# Patient Record
Sex: Female | Born: 1935 | Race: White | Hispanic: No | Smoking: Never smoker
Health system: Southern US, Community
[De-identification: ages and names within clinical notes are randomized; demographics above are authoritative.]

## PROBLEM LIST (undated history)

## (undated) DIAGNOSIS — S4291XA Fracture of right shoulder girdle, part unspecified, initial encounter for closed fracture: Secondary | ICD-10-CM

## (undated) DIAGNOSIS — I10 Essential (primary) hypertension: Secondary | ICD-10-CM

## (undated) DIAGNOSIS — I517 Cardiomegaly: Secondary | ICD-10-CM

## (undated) DIAGNOSIS — M1990 Unspecified osteoarthritis, unspecified site: Secondary | ICD-10-CM

## (undated) DIAGNOSIS — J449 Chronic obstructive pulmonary disease, unspecified: Secondary | ICD-10-CM

## (undated) DIAGNOSIS — S22000A Wedge compression fracture of unspecified thoracic vertebra, initial encounter for closed fracture: Secondary | ICD-10-CM

## (undated) DIAGNOSIS — J189 Pneumonia, unspecified organism: Secondary | ICD-10-CM

## (undated) DIAGNOSIS — K254 Chronic or unspecified gastric ulcer with hemorrhage: Secondary | ICD-10-CM

## (undated) DIAGNOSIS — M8580 Other specified disorders of bone density and structure, unspecified site: Secondary | ICD-10-CM

## (undated) DIAGNOSIS — F0390 Unspecified dementia without behavioral disturbance: Secondary | ICD-10-CM

## (undated) DIAGNOSIS — R413 Other amnesia: Secondary | ICD-10-CM

## (undated) DIAGNOSIS — E785 Hyperlipidemia, unspecified: Secondary | ICD-10-CM

## (undated) DIAGNOSIS — Z87440 Personal history of urinary (tract) infections: Secondary | ICD-10-CM

## (undated) HISTORY — DX: Chronic obstructive pulmonary disease, unspecified: J44.9

## (undated) HISTORY — DX: Other specified disorders of bone density and structure, unspecified site: M85.80

## (undated) HISTORY — PX: FRACTURE SURGERY: SHX138

## (undated) HISTORY — DX: Hyperlipidemia, unspecified: E78.5

## (undated) HISTORY — DX: Wedge compression fracture of unspecified thoracic vertebra, initial encounter for closed fracture: S22.000A

## (undated) HISTORY — PX: CATARACT EXTRACTION W/ INTRAOCULAR LENS  IMPLANT, BILATERAL: SHX1307

## (undated) HISTORY — DX: Essential (primary) hypertension: I10

## (undated) HISTORY — DX: Cardiomegaly: I51.7

## (undated) HISTORY — DX: Unspecified dementia without behavioral disturbance: F03.90

## (undated) HISTORY — DX: Personal history of urinary (tract) infections: Z87.440

---

## 1989-03-01 DIAGNOSIS — E785 Hyperlipidemia, unspecified: Secondary | ICD-10-CM

## 1989-03-01 HISTORY — DX: Hyperlipidemia, unspecified: E78.5

## 1991-03-02 HISTORY — PX: ANKLE FRACTURE SURGERY: SHX122

## 1995-03-02 DIAGNOSIS — I10 Essential (primary) hypertension: Secondary | ICD-10-CM

## 1995-03-02 HISTORY — DX: Essential (primary) hypertension: I10

## 1996-06-21 HISTORY — PX: SIGMOIDOSCOPY: SUR1295

## 1998-01-30 ENCOUNTER — Inpatient Hospital Stay (HOSPITAL_COMMUNITY): Admission: AD | Admit: 1998-01-30 | Discharge: 1998-02-01 | Payer: Self-pay | Admitting: Internal Medicine

## 1998-08-21 ENCOUNTER — Encounter: Payer: Self-pay | Admitting: Family Medicine

## 1998-08-21 ENCOUNTER — Other Ambulatory Visit: Admission: RE | Admit: 1998-08-21 | Discharge: 1998-08-21 | Payer: Self-pay | Admitting: Family Medicine

## 1999-07-24 ENCOUNTER — Encounter: Payer: Self-pay | Admitting: Family Medicine

## 1999-07-24 LAB — CONVERTED CEMR LAB: Blood Glucose, Fasting: 102 mg/dL

## 2001-05-24 ENCOUNTER — Encounter: Payer: Self-pay | Admitting: Family Medicine

## 2001-05-24 LAB — CONVERTED CEMR LAB: TSH: 3.58 microintl units/mL

## 2002-09-18 ENCOUNTER — Encounter: Payer: Self-pay | Admitting: Family Medicine

## 2003-12-17 ENCOUNTER — Ambulatory Visit: Payer: Self-pay | Admitting: Family Medicine

## 2004-02-04 ENCOUNTER — Ambulatory Visit: Payer: Self-pay | Admitting: Family Medicine

## 2004-02-04 LAB — CONVERTED CEMR LAB: TSH: 3.4 microintl units/mL

## 2004-11-25 ENCOUNTER — Ambulatory Visit: Payer: Self-pay | Admitting: Family Medicine

## 2004-11-27 ENCOUNTER — Ambulatory Visit: Payer: Self-pay | Admitting: Family Medicine

## 2004-12-14 ENCOUNTER — Ambulatory Visit: Payer: Self-pay | Admitting: Family Medicine

## 2004-12-29 ENCOUNTER — Ambulatory Visit: Payer: Self-pay | Admitting: Family Medicine

## 2004-12-29 LAB — HM MAMMOGRAPHY: HM Mammogram: NORMAL

## 2005-11-11 ENCOUNTER — Ambulatory Visit: Payer: Self-pay | Admitting: Family Medicine

## 2005-11-16 ENCOUNTER — Encounter: Payer: Self-pay | Admitting: Family Medicine

## 2005-11-16 LAB — CONVERTED CEMR LAB
Blood Glucose, Fasting: 105 mg/dL
TSH: 2.86 microintl units/mL

## 2006-03-21 ENCOUNTER — Ambulatory Visit: Payer: Self-pay | Admitting: Family Medicine

## 2006-10-19 ENCOUNTER — Encounter: Payer: Self-pay | Admitting: Family Medicine

## 2006-10-19 DIAGNOSIS — R7309 Other abnormal glucose: Secondary | ICD-10-CM | POA: Insufficient documentation

## 2006-10-19 DIAGNOSIS — E785 Hyperlipidemia, unspecified: Secondary | ICD-10-CM

## 2006-10-19 DIAGNOSIS — Z8719 Personal history of other diseases of the digestive system: Secondary | ICD-10-CM

## 2006-10-19 DIAGNOSIS — I10 Essential (primary) hypertension: Secondary | ICD-10-CM | POA: Insufficient documentation

## 2007-05-30 ENCOUNTER — Ambulatory Visit: Payer: Self-pay | Admitting: Family Medicine

## 2007-05-31 LAB — CONVERTED CEMR LAB
AST: 20 units/L (ref 0–37)
Alkaline Phosphatase: 60 units/L (ref 39–117)
Basophils Absolute: 0.1 10*3/uL (ref 0.0–0.1)
Chloride: 105 meq/L (ref 96–112)
Direct LDL: 143 mg/dL
Eosinophils Absolute: 0.1 10*3/uL (ref 0.0–0.7)
GFR calc non Af Amer: 100 mL/min
HDL: 88.5 mg/dL (ref >39.0)
MCHC: 32.8 g/dL (ref 30.0–36.0)
MCV: 90.8 fL (ref 78.0–100.0)
Neutrophils Relative %: 62.1 % (ref 43.0–77.0)
Platelets: 322 10*3/uL (ref 150–400)
Potassium: 4.2 meq/L (ref 3.5–5.1)
RDW: 12.4 % (ref 11.5–14.6)
Total Bilirubin: 0.7 mg/dL (ref 0.3–1.2)
Triglycerides: 73 mg/dL (ref 0–149)

## 2007-06-28 ENCOUNTER — Ambulatory Visit: Payer: Self-pay | Admitting: Family Medicine

## 2007-06-28 LAB — CONVERTED CEMR LAB
OCCULT 1: NEGATIVE
OCCULT 2: NEGATIVE
OCCULT 3: NEGATIVE

## 2007-06-29 ENCOUNTER — Encounter (INDEPENDENT_AMBULATORY_CARE_PROVIDER_SITE_OTHER): Payer: Self-pay | Admitting: *Deleted

## 2007-08-29 ENCOUNTER — Encounter (INDEPENDENT_AMBULATORY_CARE_PROVIDER_SITE_OTHER): Payer: Self-pay | Admitting: *Deleted

## 2007-12-04 ENCOUNTER — Ambulatory Visit: Payer: Self-pay | Admitting: Family Medicine

## 2007-12-04 ENCOUNTER — Encounter: Payer: Self-pay | Admitting: Family Medicine

## 2008-01-30 DIAGNOSIS — S4291XA Fracture of right shoulder girdle, part unspecified, initial encounter for closed fracture: Secondary | ICD-10-CM

## 2008-01-30 HISTORY — DX: Fracture of right shoulder girdle, part unspecified, initial encounter for closed fracture: S42.91XA

## 2008-02-10 ENCOUNTER — Emergency Department: Payer: Self-pay | Admitting: Internal Medicine

## 2008-02-17 ENCOUNTER — Emergency Department: Payer: Self-pay | Admitting: Internal Medicine

## 2008-05-30 ENCOUNTER — Ambulatory Visit: Payer: Self-pay | Admitting: Family Medicine

## 2008-05-30 LAB — CONVERTED CEMR LAB
ALT: 14 units/L (ref 0–35)
AST: 21 units/L (ref 0–37)
Albumin: 3.7 g/dL (ref 3.5–5.2)
Basophils Absolute: 0 10*3/uL (ref 0.0–0.1)
Chloride: 105 meq/L (ref 96–112)
Cholesterol: 225 mg/dL — ABNORMAL HIGH (ref 0–200)
Creatinine, Ser: 0.5 mg/dL (ref 0.4–1.2)
Creatinine,U: 84.5 mg/dL
Direct LDL: 129 mg/dL
Eosinophils Relative: 2.1 % (ref 0.0–5.0)
GFR calc non Af Amer: 123.23 mL/min (ref >60)
HCT: 39.2 % (ref 36.0–46.0)
Lymphocytes Relative: 26 % (ref 12.0–46.0)
Microalb Creat Ratio: 10.7 mg/g (ref 0.0–30.0)
Microalb, Ur: 0.9 mg/dL (ref 0.0–1.9)
Monocytes Relative: 11.8 % (ref 3.0–12.0)
Neutrophils Relative %: 60 % (ref 43.0–77.0)
Platelets: 281 10*3/uL (ref 150.0–400.0)
Potassium: 4.3 meq/L (ref 3.5–5.1)
RDW: 12.9 % (ref 11.5–14.6)
TSH: 4.06 microintl units/mL (ref 0.35–5.50)
Total CHOL/HDL Ratio: 3
Total Protein: 6.5 g/dL (ref 6.0–8.3)
Triglycerides: 100 mg/dL (ref 0.0–149.0)
WBC: 5.9 10*3/uL (ref 4.5–10.5)

## 2008-06-11 ENCOUNTER — Ambulatory Visit: Payer: Self-pay | Admitting: Family Medicine

## 2008-06-20 ENCOUNTER — Ambulatory Visit: Payer: Self-pay | Admitting: Family Medicine

## 2008-06-21 ENCOUNTER — Encounter (INDEPENDENT_AMBULATORY_CARE_PROVIDER_SITE_OTHER): Payer: Self-pay | Admitting: *Deleted

## 2009-04-08 ENCOUNTER — Ambulatory Visit: Payer: Self-pay | Admitting: Family Medicine

## 2009-06-05 ENCOUNTER — Ambulatory Visit: Payer: Self-pay | Admitting: Family Medicine

## 2009-06-05 LAB — CONVERTED CEMR LAB
ALT: 14 units/L (ref 0–35)
AST: 21 units/L (ref 0–37)
Albumin: 3.7 g/dL (ref 3.5–5.2)
Alkaline Phosphatase: 61 units/L (ref 39–117)
BUN: 13 mg/dL (ref 6–23)
Basophils Relative: 0.8 % (ref 0.0–3.0)
CO2: 30 meq/L (ref 19–32)
Chloride: 108 meq/L (ref 96–112)
Cholesterol: 207 mg/dL — ABNORMAL HIGH (ref 0–200)
Creatinine, Ser: 0.6 mg/dL (ref 0.4–1.2)
Direct LDL: 106.4 mg/dL
Eosinophils Absolute: 0.1 10*3/uL (ref 0.0–0.7)
Eosinophils Relative: 1.8 % (ref 0.0–5.0)
HCT: 38.7 % (ref 36.0–46.0)
Lymphs Abs: 1.5 10*3/uL (ref 0.7–4.0)
MCHC: 34 g/dL (ref 30.0–36.0)
MCV: 89.2 fL (ref 78.0–100.0)
Monocytes Absolute: 0.7 10*3/uL (ref 0.1–1.0)
Neutrophils Relative %: 65.9 % (ref 43.0–77.0)
Platelets: 311 10*3/uL (ref 150.0–400.0)
Potassium: 4.9 meq/L (ref 3.5–5.1)
RBC: 4.33 M/uL (ref 3.87–5.11)
VLDL: 20 mg/dL (ref 0.0–40.0)
WBC: 7.2 10*3/uL (ref 4.5–10.5)

## 2009-06-10 ENCOUNTER — Ambulatory Visit: Payer: Self-pay | Admitting: Family Medicine

## 2009-06-23 ENCOUNTER — Ambulatory Visit: Payer: Self-pay | Admitting: Family Medicine

## 2009-06-23 ENCOUNTER — Encounter (INDEPENDENT_AMBULATORY_CARE_PROVIDER_SITE_OTHER): Payer: Self-pay | Admitting: *Deleted

## 2009-06-23 LAB — CONVERTED CEMR LAB
OCCULT 1: NEGATIVE
OCCULT 3: NEGATIVE

## 2009-06-23 LAB — FECAL OCCULT BLOOD, GUAIAC: Fecal Occult Blood: NEGATIVE

## 2009-10-02 ENCOUNTER — Encounter (INDEPENDENT_AMBULATORY_CARE_PROVIDER_SITE_OTHER): Payer: Self-pay | Admitting: *Deleted

## 2010-04-02 NOTE — Letter (Signed)
Summary: Nadara Eaton letter  [place] at [facility]  [address] [place], [place] [address]   Phone: [phone]  Fax: [phone]       10/02/2009 MRN: [id number]  TAYLORANN TKACH [address] [place], [place]  [address]  Dear Ms. [facility] [place], and [place] announce the retirement of Arta Silence, M.D., from full-time practice at the [facility] office effective August 28, 2009 and his plans of returning part-time.  It is important to Dr. Hetty Ely and to our practice that you understand that [facility] has seven physicians in our office for your health care needs.  We will continue to offer the same exceptional care that you have today.    Dr. Hetty Ely has spoken to many of you about his plans for retirement and returning part-time in the fall.   We will continue to work with you through the transition to schedule appointments for you in the office and meet the high standards that [place] is committed to.   Again, it is with great pleasure that we share the news that Dr. Hetty Ely will return to [facility] at [facility] in October of 2011 with a reduced schedule.    If you have any questions, or would like to request an appointment with one of our physicians, please call us at [phone] and press the option for Scheduling an appointment.  We take pleasure in providing you with excellent patient care and look forward to seeing you at your next office visit.  Our [facility] Physicians are:  Tillman Abide, M.D. Laurita Quint, M.D. Roxy Manns, M.D. Kerby Nora, M.D. Hannah Beat, M.D. Ruthe Mannan, M.D. We proudly welcomed Raechel Ache, M.D. and Eustaquio Boyden, M.D. to the practice in July/August 2011.  Sincerely,  [facility]

## 2010-04-02 NOTE — Assessment & Plan Note (Signed)
Summary: FLU SHOT,PNEUMONIA SHOT/CLE  Nurse Visit   Allergies: No Known Drug Allergies  Orders Added: 1)  Flu Vaccine 52yrs + [90658] 2)  Administration Flu vaccine - MCR [G0008]  Flu Vaccine Consent Questions     Do you have a history of severe allergic reactions to this vaccine? no    Any prior history of allergic reactions to egg and/or gelatin? no    Do you have a sensitivity to the preservative Thimersol? no    Do you have a past history of Guillan-Barre Syndrome? no    Do you currently have an acute febrile illness? no    Have you ever had a severe reaction to latex? no    Vaccine information given and explained to patient? yes    Are you currently pregnant? no    Lot Number:[id number]   Exp Date:08/28/2009   Site Given  Right  Deltoid IM   Patient did not get a pneumonia vaccine because she has already had one since she has turned 77 years old per patient's daughter-in-law.

## 2010-04-02 NOTE — Assessment & Plan Note (Signed)
Summary: MEDICARE CPX  CYD   Vital Signs:  Patient profile:   77 year old female Weight:      125 pounds Temp:     98.1 degrees F oral Pulse rate:   64 / minute Pulse rhythm:   regular BP sitting:   130 / 78  (left arm) Cuff size:   regular  Vitals Entered By: Sydell Axon LPN (06/13/2009 10:49 AM) CC: 30 Minute checkup, hemoccult cards given to patient   History of Present Illness: Pt here with daughter- in-law. She is getting more forgetful. She makes many notes to herself and is doing ok. There seems to be no safety issues as she does lots of cooking and has all of her life. As a result she  therefore is used to checking her stove regularly. She is getting more broad-based in gait but really has very few aches and pains. She feels well and gets along without difficulty, has no pain and continues to be independent.  Preventive Screening-Counseling & Management  Alcohol-Tobacco     Alcohol drinks/day: 0     Smoking Status: never  Caffeine-Diet-Exercise     Caffeine use/day: 2     Does Patient Exercise: yes     Type of exercise: works around the house and yard.  Problems Prior to Update: 1)  Special Screening Malig Neoplasms Other Sites  (ICD-V76.49) 2)  Hyperglycemia  (ICD-790.29) 3)  Menopausal Syndrome  (ICD-627.2) 4)  Hypertension  (ICD-401.9) 5)  Hyperlipidemia  (ICD-272.4) 6)  Gastrointestinal Hemorrhage, Hx of  (ICD-V12.79)  Medications Prior to Update: 1)  Multivitamins   Tabs (Multiple Vitamin) .Marland Kitchen.. 1 Daily By Mouth  Allergies: No Known Drug Allergies  Past History:  Past Medical History: Last updated: 10/19/2006 Gastrointestinal hemorrhage, hx of:(1993) AGAIN 01/1998) Hyperlipidemia:(1991) Hypertension:(1997)  Family History: Last updated: 06-13-2009 Father:? DECEASED 53 YOA STOMACH PROBLEM; TERMINAL PAIN IN STOMACH LOST MIND  Mother: DECEASED 78 YOA ALZHEIMERS  BROTHER A Junious Dresser)  1  SISTER AND 1 BROTHER DIED OF CANCER 1 BROTHER SUICIDE 1  SISTER dec Dementia and senility, natural causes. 2  Sisters Alive Margaret and Estil Daft) HBP: + SISTER, + BROTHER DM: NEGATIVE GOUT/ARTHRITIS:  PROSTATE CANCER: NEGATIVE BREAST/OVARIAN/UTERINE CANCER: NEGATIVE COLON CANCER: STOMACH SISTER// ? 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 DECEASED 53 YOA STOMACH PROBLEM; TERMINAL PAIN IN STOMACH LOST MIND  Mother: DECEASED 55 YOA ALZHEIMERS  BROTHER A Junious Dresser)  1  SISTER AND 1 BROTHER DIED OF CANCER 1 BROTHER SUICIDE 1 SISTER dec Dementia and senility, natural causes. 2  Sisters Alive Margaret and Estil Daft) HBP: + SISTER, + BROTHER DM: NEGATIVE GOUT/ARTHRITIS:  PROSTATE CANCER: NEGATIVE BREAST/OVARIAN/UTERINE CANCER: NEGATIVE COLON CANCER: STOMACH SISTER// ? BROTHER UNKNOWN ETOH/DRUG ABUSE: NEGATIVE DEPRESSION: OTHER: NEGATIVE STROKE  Review of Systems General:  Denies chills, fatigue, fever, sweats, weakness, and weight loss. Eyes:  Complains of blurring; denies eye irritation and eye pain; needs new glasses. ENT:  Complains of decreased hearing; denies ear discharge, earache, and ringing in ears; longstanding hearing loss with bilat hearing aids.. CV:  Denies chest pain or discomfort, fainting, fatigue,  palpitations, shortness of breath with exertion, and weight gain. Resp:  Denies cough, shortness of breath, and wheezing. GI:  Denies abdominal pain, bloody stools, change in bowel habits, constipation, dark tarry stools,  diarrhea, indigestion, loss of appetite, nausea, vomiting, vomiting blood, and yellowish skin color; mild burping at times. One episode of 24 hr gastro a few weeks back.. GU:  Complains of nocturia and urinary frequency; denies dysuria. MS:  Denies joint pain, low back pain, muscle aches, cramps, muscle weakness, and stiffness. Derm:  Denies dryness, itching, and rash. Neuro:  Complains of memory loss; denies numbness, poor balance, tingling, and tremors; mild and functional.  Physical Exam  General:  Well-developed,well-nourished,in no acute distress; alert,appropriate and cooperative throughout examination Head:  Normocephalic and atraumatic without obvious abnormalities. No apparent alopecia or balding. Sinuses NT. Eyes:  Conjunctiva clear bilaterally.  Ears:  External ear exam shows no significant lesions or deformities.  Otoscopic examination reveals clear canals, tympanic membranes are intact bilaterally without bulging, retraction, inflammation or discharge. Hearing is significantlydecreased  with hearing aids in place bilaterally. Nose:  External nasal examination shows no deformity or inflammation. Nasal mucosa are pink and moist without lesions or exudates. Mouth:  Oral mucosa and oropharynx without lesions or exudates.  Teeth in good repair. Neck:  No deformities, masses, or tenderness noted. Chest Wall:  No deformities, masses, or tenderness noted. Breasts:  Not done due to age. Lungs:  Normal respiratory effort, chest expands symmetrically. Lungs are clear to auscultation, no crackles or wheezes. Heart:  Normal rate and regular rhythm. S1 and S2 normal without gallop, murmur, click, rub or other extra sounds. Abdomen:  Bowel sounds positive,abdomen soft and non-tender without masses, organomegaly or hernias noted. Rectal:  Not done due to age. Genitalia:  Not done due to age. Msk:  No deformity or scoliosis noted of thoracic or lumbar spine.   Pulses:  R and L  carotid,radial,femoral,dorsalis pedis and posterior tibial pulses are full and equal bilaterally Extremities:  No clubbing, cyanosis, edema, or deformity noted with normal full range of motion of all joints.   Neurologic:  No cranial nerve deficits noted. Station and gait are normal. Plantar reflexes are down-going bilaterally. DTRs are symmetrical throughout. Sensory, motor and coordinative functions appear intact. Skin:  Intact without suspicious lesions or rashes Cervical Nodes:  No lymphadenopathy noted Inguinal Nodes:  No significant adenopathy Psych:  Cognition and judgment appear intact. Alert and cooperative with normal attention span and concentration. No apparent delusions, illusions, hallucinations   Impression & Recommendations:  Problem # 1:  HYPERGLYCEMIA (ICD-790.29) Assessment Improved Normal today via labs.  Problem # 2:  HYPERTENSION (ICD-401.9) Assessment: Unchanged Good control. On no meds and will try to keep it that way. BP today: 130/78 Prior BP: 100/60 (06/11/2008)  Labs Reviewed: K+: 4.9 (06/05/2009) Creat: : 0.6 (06/05/2009)   Chol: 207 (06/05/2009)   HDL: 85.50 (06/05/2009)   LDL: DEL (05/30/2007)   TG: 100.0 (06/05/2009)  Problem # 3:  HYPERLIPIDEMIA (ICD-272.4) Assessment: Unchanged Adequate nos. Cont watching diet. Labs Reviewed: SGOT: 21 (06/05/2009)   SGPT: 14 (06/05/2009)   HDL:85.50 (06/05/2009), 83.30 (05/30/2008)  LDL:DEL (05/30/2007)  Chol:207 (06/05/2009), 225 (05/30/2008)  Trig:100.0 (06/05/2009), 100.0 (05/30/2008)  Problem # 4:  GASTROINTESTINAL HEMORRHAGE, HX OF (ICD-V12.79) Assessment: Unchanged No evidence of new sxs.  Complete Medication List: 1)  Multivitamins Tabs (Multiple vitamin) .Marland Kitchen.. 1 daily by mouth  Patient Instructions: 1)  RTC as needed.  Current Allergies (reviewed today): No known allergies

## 2010-04-02 NOTE — Letter (Signed)
Summary: Results Follow up Letter  [place] at [facility]  [address] [place], [place] [address]   Phone: [phone]  Fax: [phone]    06/23/2009 MRN: [id number]  JAMYLAH MARINACCIO [address] [place], [place]  [address]  Dear Ms. Leet,  The following are the results of your recent test(s):  Test         Result    Pap Smear:        Normal _____  Not Normal _____ Comments: ______________________________________________________ Cholesterol: LDL(Bad cholesterol):         Your goal is less than:         HDL (Good cholesterol):       Your goal is more than: Comments:  ______________________________________________________ Mammogram:        Normal _____  Not Normal _____ Comments:  ___________________________________________________________________ Hemoccult:        Normal __X___  Not normal _______ Comment: Please repeat in one year.  _____________________________________________________________________ Other Tests:    We routinely do not discuss normal results over the telephone.  If you desire a copy of the results, or you have any questions about this information we can discuss them at your next office visit.   Sincerely,     Laurita Quint, MD

## 2011-02-01 ENCOUNTER — Encounter: Payer: Self-pay | Admitting: Family Medicine

## 2011-02-02 ENCOUNTER — Encounter: Payer: Self-pay | Admitting: Family Medicine

## 2011-02-02 ENCOUNTER — Ambulatory Visit (INDEPENDENT_AMBULATORY_CARE_PROVIDER_SITE_OTHER): Payer: MEDICARE | Admitting: Family Medicine

## 2011-02-02 VITALS — BP 126/80 | HR 98 | Temp 99.0°F | Wt 124.0 lb

## 2011-02-02 DIAGNOSIS — J069 Acute upper respiratory infection, unspecified: Secondary | ICD-10-CM

## 2011-02-02 DIAGNOSIS — J40 Bronchitis, not specified as acute or chronic: Secondary | ICD-10-CM | POA: Insufficient documentation

## 2011-02-02 MED ORDER — AZITHROMYCIN 250 MG PO TABS: ORAL_TABLET | ORAL | Status: AC

## 2011-02-02 NOTE — Patient Instructions (Signed)
Sounds like viral upper respiratory infection, if that is the case should feel better each day. If not improving as expected or fever >101 or worsening cough, fill antibiotics (zpack provided). Get plenty of rest over next few days.  May use simple mucinex to break up cough and robitussin or delsym for cough suppression if needed. Good to see you today, call us with questions or if not improving as expected.

## 2011-02-02 NOTE — Progress Notes (Signed)
  Subjective:    Patient ID: Rhonda Foley, female    DOB: May 15, 1916, 77 y.o.   MRN: [id number]  HPI CC: cough, congestion  Presents with daughter in law.  5d h/o coughing.  Overall dry cough, more hoarse voice.  Decreased appetite.  + head congestion, and RN.  Having coughing fits.  Hasn't tried anything for this.  No congestion in chest.  Denies fevers/chills, sore throat, abd pain, n/v/d, headache, ear pain or tooth pain, CP, SOB.  No sick contacts at home, no smokers at home.  No h/o asthma/COPD.  Flu shot at [facility] this year.  Review of Systems Per HPI    Objective:   Physical Exam  Vitals reviewed. Constitutional: She appears well-developed and well-nourished. No distress.  HENT:  Head: Normocephalic and atraumatic.  Right Ear: External ear normal.  Left Ear: External ear normal.  Nose: No mucosal edema or rhinorrhea. Right sinus exhibits no maxillary sinus tenderness and no frontal sinus tenderness. Left sinus exhibits no maxillary sinus tenderness and no frontal sinus tenderness.  Mouth/Throat: Uvula is midline, oropharynx is clear and moist and mucous membranes are normal. No oropharyngeal exudate.  Eyes: Conjunctivae and EOM are normal. Pupils are equal, round, and reactive to light. No scleral icterus.  Neck: Normal range of motion. Neck supple. No JVD present. No thyromegaly present.  Cardiovascular: Normal rate, regular rhythm, normal heart sounds and intact distal pulses.   No murmur heard. Pulmonary/Chest: Effort normal and breath sounds normal. No respiratory distress. She has no wheezes. She has no rales.       Crackles bibasilarly  Lymphadenopathy:    She has no cervical adenopathy.  Skin: Skin is warm and dry. No rash noted.       Assessment & Plan:

## 2011-02-02 NOTE — Assessment & Plan Note (Signed)
Anticipate viral given short duration. Discussed anticipated course of illness. zpack to hold on to in case not improving as expected. RTC if worsening or concerns. Crackles heard - anticipate from atx as some clearing with deep cough. Good O2 sat, stable vital signs.

## 2011-02-05 ENCOUNTER — Telehealth: Payer: Self-pay | Admitting: Internal Medicine

## 2011-02-05 NOTE — Telephone Encounter (Signed)
Would give abx more time. Could use robitussin or delsym for cough (without decongestant). Any fevers or worsening shortness of breath?  Any burning with voiding? If worsening, may go to [facility] over weekend.

## 2011-02-05 NOTE — Telephone Encounter (Signed)
Spoke with patient and her daughter in Social worker. Advised to give abx more time as she's only been on them for a day and a half. Advised robitussin or delsym without decongestant for her cough. No fevers,SOB or burning with urination. Advise dif worsening over the weekend to go to [facility]. They verbalized understanding.

## 2011-02-05 NOTE — Telephone Encounter (Signed)
Patient's daughter in-law called and stated that she started her antibiotic and and patient is complaining about sore back and is coughing up dark brownish mucous and she also is urinating frequently.  Wanted to know if there is anything else she could do for her.

## 2011-02-11 ENCOUNTER — Encounter: Payer: Self-pay | Admitting: Family Medicine

## 2011-02-11 ENCOUNTER — Ambulatory Visit (INDEPENDENT_AMBULATORY_CARE_PROVIDER_SITE_OTHER): Payer: MEDICARE | Admitting: Family Medicine

## 2011-02-11 DIAGNOSIS — R3915 Urgency of urination: Secondary | ICD-10-CM

## 2011-02-11 DIAGNOSIS — R630 Anorexia: Secondary | ICD-10-CM | POA: Insufficient documentation

## 2011-02-11 DIAGNOSIS — J40 Bronchitis, not specified as acute or chronic: Secondary | ICD-10-CM

## 2011-02-11 LAB — POCT URINALYSIS DIPSTICK
Nitrite, UA: NEGATIVE
Spec Grav, UA: 1.02
Urobilinogen, UA: 0.2
pH, UA: 6.5

## 2011-02-11 MED ORDER — CIPROFLOXACIN HCL 250 MG PO TABS: 250.0000 mg | ORAL_TABLET | Freq: Two times a day (BID) | ORAL | Status: AC

## 2011-02-11 NOTE — Assessment & Plan Note (Addendum)
Check UA given endorsing urinary urgency and frequency. UA and micro concern for infection. Treat with cipro x 3 days as well as sent UCx. Push fluids.

## 2011-02-11 NOTE — Assessment & Plan Note (Addendum)
Treated with zpack. Anticipate may just need more time for strength to return. + crackles on exam, but as overall clinically improving, will monitor. To return sooner if worsening. Recheck crackles when returns for [facility] visit.

## 2011-02-11 NOTE — Progress Notes (Signed)
  Subjective:    Patient ID: Rhonda Foley, female    DOB: 07-24-1916, 77 y.o.   MRN: [id number]  HPI CC: f/u URTI  Seen here 02/02/2011 with dx viral URTI, treated with supportive care.  zpack provided in case not improving as expected, took zpack.  Did progress to cough with brown mucous.  That has improved.  Now cough is almost gone.  Also initially with soreness right posterior ribs, now resolved.  Denies recurrent fevers/chills, chest tightness or pain, SOB.  No fatigue.  Endorses feeling "weak".  Appetite still down.  Daughter was concerned because weakness and decreased appetite has been new.  Yesterday had 1 chicken tender for dinner.  Today did eat better breakfast.  Some increased urinary frequency and urgency recently.  No dysuria, fevers/chills, abd pain, flank pain.  Wt Readings from Last 3 Encounters:  02/11/11 123 lb (55.792 kg)  02/02/11 124 lb (56.246 kg)  06/10/09 125 lb (56.7 kg)   Review of Systems Per HPI    Objective:   Physical Exam  Nursing note and vitals reviewed. Constitutional: She appears well-developed and well-nourished. No distress.  HENT:  Head: Normocephalic and atraumatic.  Mouth/Throat: Oropharynx is clear and moist. No oropharyngeal exudate.  Eyes: Conjunctivae and EOM are normal. Pupils are equal, round, and reactive to light. No scleral icterus.       anisocoria  Neck: Normal range of motion. Neck supple.  Cardiovascular: Normal rate, regular rhythm, normal heart sounds and intact distal pulses.   No murmur heard. Pulmonary/Chest: Effort normal and breath sounds normal. No respiratory distress. She has no wheezes. She has no rales.       Mild crackles bibasilarly  Abdominal: Soft. She exhibits no mass. Bowel sounds are increased. There is no hepatosplenomegaly. There is no tenderness. There is no rigidity, no rebound, no guarding, no CVA tenderness and negative Murphy's sign.  Lymphadenopathy:    She has no cervical adenopathy.  Skin: Skin  is warm and dry. No rash noted.  Psychiatric: She has a normal mood and affect.       Assessment & Plan:

## 2011-02-11 NOTE — Patient Instructions (Addendum)
Urine checked today.  Urine culture sent.  Looks like infection in urine.  Take cipro twice daily for 3 days.  Push fluids and rest. We may just need more time to improve from recent bronchitis. May try 1/2 ensure or boost 30 min prior to mealtime to see if . If any shortness of breath, chest pain, or cough returning or fever >101, let me know. Return at your convenience fasting for blood work, afterwards for [employer] visit.

## 2011-02-11 NOTE — Assessment & Plan Note (Signed)
After bronchitis, in setting of likely UTI. Treat infections and reassess. Also recommended 1/2 can ensure/boost prior to one meal/day as appetite stimulant.

## 2011-02-13 LAB — URINE CULTURE: Colony Count: 10000

## 2011-02-14 ENCOUNTER — Telehealth: Payer: Self-pay | Admitting: Family Medicine

## 2011-02-14 MED ORDER — AMOXICILLIN 500 MG PO CAPS: 500.0000 mg | ORAL_CAPSULE | Freq: Three times a day (TID) | ORAL | Status: DC

## 2011-02-14 NOTE — Telephone Encounter (Signed)
Please notify urine returned growing a bacteria that may not have been covered by cipro.  I would like to place her on amoxicillin twice daily for 7 days.  Take with food, take yogurt afterwards.  [facility] [place].

## 2011-02-15 MED ORDER — AMOXICILLIN 500 MG PO CAPS: 500.0000 mg | ORAL_CAPSULE | Freq: Two times a day (BID) | ORAL | Status: AC

## 2011-02-15 NOTE — Telephone Encounter (Signed)
Patient's daughter in law notified. I did want to confirm BID or TID dosing. Rx said TID but quantity of #14, and then note said BID. Could you clarify please?

## 2011-02-15 NOTE — Telephone Encounter (Signed)
Should be BID dosing. Sent in new script with correct dose. tahnsk.

## 2011-02-28 ENCOUNTER — Other Ambulatory Visit: Payer: Self-pay | Admitting: Family Medicine

## 2011-02-28 DIAGNOSIS — Z0000 Encounter for general adult medical examination without abnormal findings: Secondary | ICD-10-CM

## 2011-02-28 DIAGNOSIS — N951 Menopausal and female climacteric states: Secondary | ICD-10-CM

## 2011-03-09 ENCOUNTER — Other Ambulatory Visit (INDEPENDENT_AMBULATORY_CARE_PROVIDER_SITE_OTHER): Payer: MEDICARE

## 2011-03-09 DIAGNOSIS — N951 Menopausal and female climacteric states: Secondary | ICD-10-CM

## 2011-03-09 DIAGNOSIS — Z0000 Encounter for general adult medical examination without abnormal findings: Secondary | ICD-10-CM

## 2011-03-09 DIAGNOSIS — E7800 Pure hypercholesterolemia, unspecified: Secondary | ICD-10-CM

## 2011-03-09 DIAGNOSIS — J40 Bronchitis, not specified as acute or chronic: Secondary | ICD-10-CM

## 2011-03-09 DIAGNOSIS — R630 Anorexia: Secondary | ICD-10-CM

## 2011-03-09 LAB — CBC WITH DIFFERENTIAL/PLATELET
Basophils Relative: 0.9 % (ref 0.0–3.0)
Eosinophils Relative: 3.2 % (ref 0.0–5.0)
Lymphocytes Relative: 24.1 % (ref 12.0–46.0)
Monocytes Relative: 8.5 % (ref 3.0–12.0)
Neutrophils Relative %: 63.3 % (ref 43.0–77.0)
RBC: 4.34 Mil/uL (ref 3.87–5.11)
WBC: 6.1 10*3/uL (ref 4.5–10.5)

## 2011-03-09 LAB — COMPREHENSIVE METABOLIC PANEL
BUN: 11 mg/dL (ref 6–23)
CO2: 28 mEq/L (ref 19–32)
Calcium: 9.1 mg/dL (ref 8.4–10.5)
Chloride: 107 mEq/L (ref 96–112)
Creatinine, Ser: 0.7 mg/dL (ref 0.4–1.2)
GFR: 87.37 mL/min (ref >60.00)

## 2011-03-09 LAB — LIPID PANEL
Cholesterol: 262 mg/dL — ABNORMAL HIGH (ref 0–200)
Total CHOL/HDL Ratio: 3

## 2011-03-09 NOTE — Progress Notes (Signed)
Addended by: Baldomero Lamy on: 03/09/2011 09:45 AM   Modules accepted: Orders

## 2011-03-10 LAB — VITAMIN D 25 HYDROXY (VIT D DEFICIENCY, FRACTURES): Vit D, 25-Hydroxy: 33 ng/mL (ref 30–89)

## 2011-03-16 ENCOUNTER — Encounter: Payer: MEDICARE | Admitting: Family Medicine

## 2011-03-17 ENCOUNTER — Ambulatory Visit (INDEPENDENT_AMBULATORY_CARE_PROVIDER_SITE_OTHER): Payer: MEDICARE | Admitting: Family Medicine

## 2011-03-17 ENCOUNTER — Encounter: Payer: Self-pay | Admitting: Family Medicine

## 2011-03-17 DIAGNOSIS — Z0000 Encounter for general adult medical examination without abnormal findings: Secondary | ICD-10-CM

## 2011-03-17 DIAGNOSIS — R630 Anorexia: Secondary | ICD-10-CM

## 2011-03-17 DIAGNOSIS — R413 Other amnesia: Secondary | ICD-10-CM

## 2011-03-17 DIAGNOSIS — F0390 Unspecified dementia without behavioral disturbance: Secondary | ICD-10-CM | POA: Insufficient documentation

## 2011-03-17 MED ORDER — ZOSTER VACCINE LIVE 19400 UNT/0.65ML ~~LOC~~ SOLR: 0.6500 mL | Freq: Once | SUBCUTANEOUS | Status: AC

## 2011-03-17 NOTE — Progress Notes (Signed)
Subjective:    Patient ID: Rhonda Foley, female    DOB: [birth year], 78 y.o.   MRN: [id number]  HPI CC: medicare wellness visit  Presents with daughter in law who notices pt has been having some trouble when going up steps, some SOB.  No trouble with sleep at night.  No cough.  Last month had bout of bronchitis then UTI that grew 10k enterococcus.  Treated with amoxicillin x 7 days.  took 12 days to finish abx (forgot to take on some days).    Tries to minimize driving.  Notes some trouble with memory.  Has forgotten to turn oven off in past - has burned apples before.  Enjoys making apple fritters.  No concerns today.  Feeling well.  Has increased yogurt intake.  Never started ensure supplements.  Eating better - appetite up. Wt Readings from Last 3 Encounters:  03/17/11 123 lb (55.792 kg)  02/11/11 123 lb (55.792 kg)  02/02/11 124 lb (56.246 kg)   Preventative: Mammogram - unsure when last done - will check into this.  Will reschedule. Colon - never had colonoscopy.  Does iFOB. [facility] Per pt and daughterin law, has had pneumonia shot - pulled paper chart and none documented.  May need. Shingles shot has been discussed.  Would like.  I will send script to pharmacy. Tetanus 2004. Sees audiologist every 4-6 mo.  Recent vision exam, new glasses. denies depression - no anhedonia.  Likes to socialize. Denies recent falls. Advanced directives - they have living will at home (would want sons to be HCPOA)  Medications and allergies reviewed and updated in chart.  Past histories reviewed and updated if relevant as below. Patient Active Problem List  Diagnoses  . HYPERLIPIDEMIA  . HYPERTENSION  . MENOPAUSAL SYNDROME  . HYPERGLYCEMIA  . GASTROINTESTINAL HEMORRHAGE, HX OF  . Bronchitis  . Urinary urgency  . Anorexia   Past Medical History  Diagnosis Date  . H/O: GI bleed 1993; 12/99  . Other and unspecified hyperlipidemia 1991  . History of hypertension 1997    Past Surgical History  Procedure Date  . Ankle fracture surgery 1993  . Sigmoidoscopy 06/21/96  . Shoulder surgery 12/09    Right; fracture--Dr. Lenard Forth   History  Substance Use Topics  . Smoking status: Never Smoker   . Smokeless tobacco: Not on file  . Alcohol Use: No   Family History  Problem Relation Age of Onset  . Other Father     stomach problem; terminal stomach pain  . Other Father     "lost his mind"  . Alzheimer's disease Mother   . Cancer Brother   . Cancer Sister   . Dementia Sister   . Hypertension Brother   . Hypertension Sister    No Known Allergies No current outpatient prescriptions on file prior to visit.   Review of Systems  Constitutional: Negative for fever, chills, activity change, appetite change, fatigue and unexpected weight change.  HENT: Negative for hearing loss and neck pain.   Eyes: Negative for visual disturbance.  Respiratory: Positive for cough. Negative for chest tightness, shortness of breath and wheezing.   Cardiovascular: Negative for chest pain, palpitations and leg swelling.  Gastrointestinal: Negative for nausea, vomiting, abdominal pain, diarrhea, constipation, blood in stool and abdominal distention.  Genitourinary: Negative for hematuria and difficulty urinating.  Musculoskeletal: Negative for myalgias and arthralgias.  Skin: Negative for rash.  Neurological: Negative for dizziness, seizures, syncope and headaches.  Hematological: Does  not bruise/bleed easily.  Psychiatric/Behavioral: Negative for dysphoric mood. The patient is not nervous/anxious.        Objective:   Physical Exam  Nursing note and vitals reviewed. Constitutional: She is oriented to person, place, and time. She appears well-developed and well-nourished. No distress.  HENT:  Head: Normocephalic and atraumatic.  Right Ear: External ear normal.  Left Ear: External ear normal.  Nose: Nose normal.  Mouth/Throat: Oropharynx is clear and moist. No  oropharyngeal exudate.  Eyes: Conjunctivae and EOM are normal. Pupils are equal, round, and reactive to light. No scleral icterus.  Neck: Normal range of motion. Neck supple. No thyromegaly present.  Cardiovascular: Normal rate, regular rhythm, normal heart sounds and intact distal pulses.   No murmur heard. Pulses:      Radial pulses are 2+ on the right side, and 2+ on the left side.  Pulmonary/Chest: Effort normal and breath sounds normal. No respiratory distress. She has no wheezes. She has no rales.       Crackles bibasilarly. Breast exam deferred 2/2 age  Abdominal: Soft. Bowel sounds are normal. She exhibits no distension and no mass. There is no tenderness. There is no rebound and no guarding.  Genitourinary:       deferred  Musculoskeletal: Normal range of motion. She exhibits edema (mild pitting pedal edema).  Lymphadenopathy:    She has no cervical adenopathy.  Neurological: She is alert and oriented to person, place, and time.       CN grossly intact, station and gait intact  Skin: Skin is warm and dry. No rash noted.  Psychiatric: She has a normal mood and affect. Her behavior is normal. Judgment and thought content normal.      Assessment & Plan:

## 2011-03-17 NOTE — Assessment & Plan Note (Signed)
Short cognitive testing positive for memory problems (asked to scan). 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 Consider formal testing with MMSE at next visit.

## 2011-03-17 NOTE — Assessment & Plan Note (Signed)
Actually improved.

## 2011-03-17 NOTE — Assessment & Plan Note (Addendum)
I have personally reviewed the Medicare Annual Wellness questionnaire and have noted 1. The patient's medical and social history 2. Their use of alcohol, tobacco or illicit drugs 3. Their current medications and supplements 4. The patient's functional ability including ADL's, fall risks, home safety risks and hearing or visual impairment. 5. Diet and physical activities 6. Evidence for depression or mood disorders The patients weight, height, BMI have been recorded in the chart.  Hearing and vision has been addressed. I have made referrals, counseling and provided education to the patient based on review of the above and I have provided the pt with a written personalized care plan for preventive services.  May need pneumonia shot in future (no records of receiving). Reviewed preventative protocols.  Reviewed blood work in detail. Will check about when next mammogram is due. Sent home with iFOB today. Fall screen negative, depression screen negative. Asked to scan medicare wellness form into chart.

## 2011-03-17 NOTE — Patient Instructions (Signed)
Check about last mammogram, may be due for another. We have sent you home with stool kit. I've sent in zostavax (shingles shot) to your pharmacy.  Let us know when it's placed I do think there is some memory trouble going on - we need to be careful with driving and cooking Return in 1 year or as needed. Good to see you today.

## 2011-04-02 LAB — FECAL OCCULT BLOOD, GUAIAC: Fecal Occult Blood: NEGATIVE

## 2011-04-08 ENCOUNTER — Other Ambulatory Visit: Payer: MEDICARE

## 2011-04-08 DIAGNOSIS — Z1211 Encounter for screening for malignant neoplasm of colon: Secondary | ICD-10-CM

## 2011-04-08 LAB — FECAL OCCULT BLOOD, IMMUNOCHEMICAL: Fecal Occult Bld: NEGATIVE

## 2011-04-09 ENCOUNTER — Encounter: Payer: Self-pay | Admitting: *Deleted

## 2011-04-09 ENCOUNTER — Encounter: Payer: Self-pay | Admitting: Family Medicine

## 2011-04-12 ENCOUNTER — Telehealth: Payer: Self-pay | Admitting: Radiology

## 2011-04-12 ENCOUNTER — Other Ambulatory Visit (INDEPENDENT_AMBULATORY_CARE_PROVIDER_SITE_OTHER): Payer: MEDICARE | Admitting: Family Medicine

## 2011-04-12 ENCOUNTER — Other Ambulatory Visit: Payer: Self-pay | Admitting: Family Medicine

## 2011-04-12 ENCOUNTER — Telehealth: Payer: Self-pay | Admitting: Family Medicine

## 2011-04-12 DIAGNOSIS — R410 Disorientation, unspecified: Secondary | ICD-10-CM

## 2011-04-12 DIAGNOSIS — F29 Unspecified psychosis not due to a substance or known physiological condition: Secondary | ICD-10-CM

## 2011-04-12 LAB — POCT URINALYSIS DIPSTICK
Bilirubin, UA: NEGATIVE
Glucose, UA: NEGATIVE
Nitrite, UA: NEGATIVE
Urobilinogen, UA: 0.2
pH, UA: 7.5

## 2011-04-12 MED ORDER — CIPROFLOXACIN HCL 250 MG PO TABS: 250.0000 mg | ORAL_TABLET | Freq: Two times a day (BID) | ORAL | Status: AC

## 2011-04-12 NOTE — Telephone Encounter (Signed)
UTI did present like this in 01/2011.  No more slots available today.  Would like her to drop off urine for dip/micro.

## 2011-04-12 NOTE — Telephone Encounter (Signed)
Patient was seen in December for a UTI.  Patient's daughter in law said patient is confused like she was when she had the UTI.  I tried to schedule an appointment for patient today,but the daughter in law wants to know if they can just leave a speciman instead of scheduling an office visit.  [facility]

## 2011-04-12 NOTE — Telephone Encounter (Signed)
Opened this encounter so I could release urine culture order

## 2011-04-12 NOTE — Telephone Encounter (Signed)
Message left for daughter in law to bring in urine specimen for UA.

## 2011-04-14 LAB — URINE CULTURE

## 2011-04-15 ENCOUNTER — Ambulatory Visit (INDEPENDENT_AMBULATORY_CARE_PROVIDER_SITE_OTHER): Payer: MEDICARE | Admitting: Family Medicine

## 2011-04-15 ENCOUNTER — Encounter: Payer: Self-pay | Admitting: Family Medicine

## 2011-04-15 DIAGNOSIS — R413 Other amnesia: Secondary | ICD-10-CM

## 2011-04-15 DIAGNOSIS — N390 Urinary tract infection, site not specified: Secondary | ICD-10-CM | POA: Insufficient documentation

## 2011-04-15 MED ORDER — DONEPEZIL HCL 5 MG PO TABS: 5.0000 mg | ORAL_TABLET | Freq: Every day | ORAL | Status: DC

## 2011-04-15 NOTE — Progress Notes (Signed)
Subjective:    Patient ID: Rhonda Foley, female    DOB: December 02, 1916, 78 y.o.   MRN: [id number]  HPI CC: f/u possible UTI  Rhonda Foley is a pleasant overall healthy 78 yo.  Started on cipro course after daughter in law concerned with repeat UTI (prior UTI late last year presented with slight worsening confusion) and dropped off urine sample concerning for infection.  However UCx returned inconclusive with 20k MBM.    Overall feeling well, no concerns.  Tolerating antibiotic well.  Appetite ok.  Endorses some frequency over last few days and urgency.  No dysuria.  No abd pain, fevers/chills, n/v.  Denies bladder accidents.  According to daughter in law still having memory issues.  Over weekend did have different schedule which may have caused more stress and more trouble with orientation.  No h/o urinary problems in past.  Does drink significant amt sweet tea.    Rhonda Foley stays at home alone, but has family around the corner.  Daughter in law calls her every morning.  Someone passes by every day to check on her.  Children stay on her.  Try to take her out of house on trips daily.  Rhonda Foley doesn't drive anymore, in last several weeks.  Denies confusion or trouble with memory.  Independent in ADLs.  Dependent in IADLs.  Daughter in Product manager and pays bills.  Mother with h/o alzheimer's  Pt and daughter in law denies falls in last year.   Denies depression, anhedonia.  Enjoys socializing.  Wt Readings from Last 3 Encounters:  04/15/11 125 lb 8 oz (56.926 kg)  03/17/11 123 lb (55.792 kg)  02/11/11 123 lb (55.792 kg)   Geriatric Assessment:  Activities of Daily Living:     Bathing- independent    Dressing- independent    Eating- independent    Toileting- independent    Transferring- independent    Continence- independent Overall Assessment: independent  Instrumental Activities of Daily Living:     Transportation- dependent    Meal/Food Preparation- partially  dependent    Shopping Errands- dependent    Housekeeping/Chores- partially dependent     Money Management/Finances-dependent    Medication Management- dependent    Ability to Use Telephone- independent    Laundry- dependent Overall Assessment:   Mostly dependent  Mental Status Exam: 14/30 (value/max value)     Clock Drawing Score: 2/4  Reviewed recent UA from sample provided (in yogurt cup): Color, UA  Yellow   Clarity, UA  Hazy   Glucose, UA  Negative   Bilirubin, UA  Negative   Ketones, UA  Negative   Spec Grav, UA  1.010   Blood, UA  Trace   pH, UA  7.5   Protein, UA  Negative   Urobilinogen, UA  0.2   Nitrite, UA  Negative   Leukocytes, UA  moderate (2+)   Resulting Agency  [facility]   Result Impression      Micro: WBC: 1-5 RBC: rare Bact: 2+ Casts: none Mucous present Epi: rare     Review of Systems Per HPI    Objective:   Physical Exam  Nursing note and vitals reviewed. Constitutional: She appears well-developed and well-nourished. No distress.  HENT:  Head: Normocephalic and atraumatic.  Mouth/Throat: Oropharynx is clear and moist. No oropharyngeal exudate.  Eyes: Conjunctivae and EOM are normal. No scleral icterus.       anisocoria  Neck: Normal range of motion. Neck supple.  Cardiovascular: Normal rate, regular rhythm, normal  heart sounds and intact distal pulses.   No murmur heard. Pulmonary/Chest: Effort normal and breath sounds normal. No respiratory distress. She has no wheezes. She has no rales.       Mild crackles bibasilarly  Abdominal: Soft. Bowel sounds are normal. She exhibits no distension and no mass. There is no tenderness. There is no rebound, no guarding and no CVA tenderness.  Musculoskeletal: She exhibits edema (mild nonpitting edema).  Lymphadenopathy:    She has no cervical adenopathy.  Skin: Skin is warm and dry. No rash noted.  Psychiatric: She has a normal mood and affect. Her behavior is normal.       Assessment & Plan:

## 2011-04-15 NOTE — Patient Instructions (Signed)
There is some memory impairment going on.   May do trial of aricept - start at low dose of 5mg  daily. Call me if nausea or abdominal pain. Finish antibiotics for now.  Urine culture did return inconclusive. Good to see you today!  Call us with questions.

## 2011-04-15 NOTE — Assessment & Plan Note (Signed)
Treating possible UTI with 7d cipro course, rec finish abx course.

## 2011-04-15 NOTE — Assessment & Plan Note (Addendum)
MMSE 14/30, 2/4 clock drawing test. Anticipate singificant component of dementia, likely senile. +fmhx Alz dementia. Discussed option of trial aricept - pt and daughter in law agree with trial of this.  Discussed common side effects.  Caution with h/o GI bleed. Discussed with daughter in law and pt - rec against driving, rec against cooking. They will look into option of daycare center which I think is a very good idea. Discussed concern of pt living at home alone. To call me in 1 mo with update. Fortunate that very good family support is present.

## 2011-06-30 DIAGNOSIS — F0390 Unspecified dementia without behavioral disturbance: Secondary | ICD-10-CM

## 2011-06-30 HISTORY — DX: Unspecified dementia, unspecified severity, without behavioral disturbance, psychotic disturbance, mood disturbance, and anxiety: F03.90

## 2011-07-28 ENCOUNTER — Ambulatory Visit (INDEPENDENT_AMBULATORY_CARE_PROVIDER_SITE_OTHER): Payer: MEDICARE | Admitting: Family Medicine

## 2011-07-28 ENCOUNTER — Encounter: Payer: Self-pay | Admitting: Family Medicine

## 2011-07-28 VITALS — BP 156/70 | HR 72 | Temp 98.0°F | Wt 122.8 lb

## 2011-07-28 DIAGNOSIS — R42 Dizziness and giddiness: Secondary | ICD-10-CM

## 2011-07-28 NOTE — Patient Instructions (Addendum)
EKG overall looking ok. If dizzy episode or any heart irregularity happens again, please let us know for referral to heart doctor. Keep track of blood pressures for next 1-2 weeks and call me with numbers to see if we need to add any medicine Good to see you today, call us with questions.

## 2011-07-28 NOTE — Progress Notes (Signed)
  Subjective:    Patient ID: Rhonda Foley, female    DOB: [birth year], 78 y.o.   MRN: [id number]  HPI CC: check blood pressure  Presents with daughter in law.  Today [name] is feeling well.  However on Friday had an episode where had trouble getting up from sofa - "I was dizzy".  Crawled to phone and called son who checked on her.  Dizziness lasted most of the day, blood pressure was elevated to 160-70/90s.  On the next day, dizziness was better.  For last several days blood pressure has stayed elevated - 170/90s.  Trouble describing dizziness, but overall denies presyncope, vertigo, or imbalance.  Just "funny feeling".  Some difficulty as historian because of dementia.  Denies fevers/chills, nausea with this, headaches, chest pain/tightness, diaphoresis.  Denies unilateral weakness, trouble with slurred speech, or headache.  Does endorse some "funny feeling in chest" but no pain.  Endorses some heart fluttering that started with dizziness.   Dx with dementia with MMSE of 14/30 last visit (04/2011), thought senile although +fmhx alzheimer's type dementia.  Started on aricept last visit but currently off.  Trouble getting her to take daily, and didn't notice much difference.  Did trial of this for 1 month (lasted 6 wks) then stopped.  Past Medical History  Diagnosis Date  . H/O: GI bleed 1993; 12/99  . Other and unspecified hyperlipidemia 1991  . History of hypertension 1997  . History of UTI   . Dementia 06/2011    MMSE 14/30, thought senial although fmhx alz type; trial of aricept     Review of Systems Per HPI    Objective:   Physical Exam  Nursing note and vitals reviewed. Constitutional: She appears well-developed and well-nourished. No distress.  HENT:  Head: Normocephalic and atraumatic.  Mouth/Throat: Oropharynx is clear and moist. No oropharyngeal exudate.  Eyes: Conjunctivae and EOM are normal. Pupils are equal, round, and reactive to light. No scleral icterus.  Neck:  Normal range of motion. Neck supple. No hepatojugular reflux and no JVD present. Carotid bruit is not present.  Cardiovascular: Normal rate, regular rhythm, normal heart sounds and intact distal pulses.   No murmur heard. Pulmonary/Chest: Effort normal and breath sounds normal. No respiratory distress. She has no wheezes. She has no rales.  Musculoskeletal: She exhibits edema (trace R>L pedal edema, longstanding).  Lymphadenopathy:    She has no cervical adenopathy.  Neurological: She has normal strength. No cranial nerve deficit or sensory deficit. She exhibits normal muscle tone. She displays a negative Romberg sign.       At baseline slight asymmetry with eyebrow raise (L>R) No pronator drift. Normal FTN.  Skin: Skin is warm and dry. No rash noted.       Assessment & Plan:

## 2011-07-28 NOTE — Assessment & Plan Note (Addendum)
With some hypertension recently. Pt endorsing what sound like palpitations leading to lightheadedness, no clear presyncope. EKG today - NSR at rate 74, normal axis, borderline prolonged PR, no hypertrophy, no acute ST/T changes.  Overall stable. Given ?PR prolongation, did not add B blocker. Discussed monitoring given isolated episode vs referral to cards for discussion of event monitor. Caregiver prefers to monitor for now, will update me if rpt event for referral. Will call me in next 1-2 wks for update with blood pressures to see if any med needed (would start with HCTZ)

## 2011-08-05 ENCOUNTER — Telehealth: Payer: Self-pay

## 2011-08-05 DIAGNOSIS — I10 Essential (primary) hypertension: Secondary | ICD-10-CM

## 2011-08-05 NOTE — Telephone Encounter (Signed)
Since she is not having any symptoms, I would hold off on Rx At her age, it is a tough decision to make about meds I will leave this to Dr Reece Agar --I see he is back on the 11th

## 2011-08-05 NOTE — Telephone Encounter (Signed)
[name] called with BP readings. Pt saw Dr Sharen Hones on 07/28/11 and requested call back with BP readings. 07/28/11 PM - 170/88;  07/29/11 AM 156/76;  07/30/11 AM 169/83, PM 165/81;   08/01/11 AM 147/78;   08/02/11 AM 151/72  PM 180/90; 08/03/11 PM 170/82;   08/04/11 PM 188/88 and 08/05/11 AM 167/80. Pt not having any more dizziness and no irregular heart complaints to [name]'s knowledge. [facility]. Is this OK to wait until Dr Sharen Hones' return?

## 2011-08-06 NOTE — Telephone Encounter (Signed)
Fredrich Romans notified as instructed by phone.  If pts condition worsens or changes will call back or if at night will go to ER if needed. Forwarding to Dr Reece Agar.

## 2011-08-09 NOTE — Telephone Encounter (Signed)
she has had difficulty remembering to take daily meds in past, but with blood pressures so high, I'd probably want [place] to try and treat. May start hctz 12.5mg  daily - sent into pharmacy.  This is a water pill.   To come in in 2 wks for blood work to ensure she is tolerating medicine well. Let us know how she does on this.

## 2011-08-10 NOTE — Telephone Encounter (Signed)
Message left for Rhonda Foley to return my call.

## 2011-08-11 NOTE — Telephone Encounter (Signed)
[name] notified. She will have patient start med and will monitor her taking it. She will call with any problems or concerns. Lab appt scheduled.

## 2011-08-13 MED ORDER — HYDROCHLOROTHIAZIDE 12.5 MG PO TABS: 12.5000 mg | ORAL_TABLET | Freq: Every day | ORAL | Status: DC

## 2011-08-13 NOTE — Addendum Note (Signed)
Addended by: Patience Musca on: 08/13/2011 11:52 AM   Modules accepted: Orders

## 2011-08-13 NOTE — Telephone Encounter (Signed)
Bonita Quin [phone] called med not at pharmacy. HCTZ 12.5 mg taking one tab by mouth daily # 30 x 0 [facility] to [facility]. Bonita Quin will check with pharmacy later today.

## 2011-08-17 ENCOUNTER — Telehealth: Payer: Self-pay

## 2011-08-17 NOTE — Telephone Encounter (Signed)
Rhonda Foley called pt feels OK this morning but states feels something in chest; [name] cked  Heart rate 80 but irregular; skips about every 5th beat. BP today 136/70. Pt started BPmed 08/16/11. No dizziness and no chest pain. Rhonda Foley does not want to make appt  Unless Dr Reece Agar thinks needs to see pt. [facility] [address]Please advise. If pt condition worsens Rhonda Foley will call back.

## 2011-08-18 NOTE — Telephone Encounter (Signed)
Spoke with Bonita Quin. bp better than previously. Irregular heart beat on and off.  Denies chest pain, SOB, dizziness. Would like to monitor for now, will update me if sxs worsening.

## 2011-08-30 ENCOUNTER — Other Ambulatory Visit (INDEPENDENT_AMBULATORY_CARE_PROVIDER_SITE_OTHER): Payer: MEDICARE

## 2011-08-30 DIAGNOSIS — I10 Essential (primary) hypertension: Secondary | ICD-10-CM

## 2011-08-30 LAB — BASIC METABOLIC PANEL
Chloride: 103 mEq/L (ref 96–112)
Creatinine, Ser: 0.7 mg/dL (ref 0.4–1.2)
Potassium: 4.1 mEq/L (ref 3.5–5.1)
Sodium: 142 mEq/L (ref 135–145)

## 2011-08-31 ENCOUNTER — Telehealth: Payer: Self-pay

## 2011-08-31 NOTE — Telephone Encounter (Signed)
[name] left v/m returning Kim's call. Please call [phone].

## 2011-09-01 ENCOUNTER — Ambulatory Visit (INDEPENDENT_AMBULATORY_CARE_PROVIDER_SITE_OTHER): Payer: MEDICARE | Admitting: Family Medicine

## 2011-09-01 ENCOUNTER — Encounter: Payer: Self-pay | Admitting: Family Medicine

## 2011-09-01 VITALS — BP 120/80 | Temp 97.9°F | Ht <= 58 in | Wt 125.8 lb

## 2011-09-01 DIAGNOSIS — I872 Venous insufficiency (chronic) (peripheral): Secondary | ICD-10-CM | POA: Insufficient documentation

## 2011-09-01 NOTE — Assessment & Plan Note (Signed)
I feel this is causing the bilat leg swelling (below the knee) and redness  Varicose veins / poss mild phlebitis/ no s/s of cellulitis  No cardiac red flags bp well controlled and rev most recent chem labs - re assuring Disc symptoms to watch for  Recommended inc in water/ cuttting sodium intake/ elevation of legs and trial of otc supp knee highs (if effective and tolerated well could consider some px hose)  Will update if worse or no improvement

## 2011-09-01 NOTE — Telephone Encounter (Signed)
I will see her as planned 

## 2011-09-01 NOTE — Telephone Encounter (Signed)
Spoke with Bonita Quin. She is concerned about pitting edema to pt's feet and ankles. Appt made for eval today with Dr. Milinda Antis in Dr. Timoteo Expose absence. No SOB or CP, but abd seems distended as well.

## 2011-09-01 NOTE — Progress Notes (Signed)
Subjective:    Patient ID: Rhonda Foley, female    DOB: 09-16-1916, 78 y.o.   MRN: [id number]  HPI Pt is here for LE edema  She has dementia - baseline  Does not tend to complain much  Wt is up 3 lb   Hx of HTN  Takes low dose hctz-- started that fairly recently for HTN (with symptom of dizziness)  Has been taking pretty much every day  ? If she tends to swell in the summer time   Swelling started more than a week ago - R and then both  Some redness  No pain  No weeping or sores   No heart history at all  No recent sob or cp  No PND or orthopnea  She does have varicose veins  No support stockings for a long time   bp is hard to hear    Chemistry      Component Value Date/Time   NA 142 08/30/2011 0910   K 4.1 08/30/2011 0910   CL 103 08/30/2011 0910   CO2 27 08/30/2011 0910   BUN 15 08/30/2011 0910   CREATININE 0.7 08/30/2011 0910      Component Value Date/Time   CALCIUM 9.4 08/30/2011 0910   ALKPHOS 68 03/09/2011 0904   AST 21 03/09/2011 0904   ALT 14 03/09/2011 0904   BILITOT 0.5 03/09/2011 0904     Lab Results  Component Value Date   ALT 14 03/09/2011   AST 21 03/09/2011   ALKPHOS 68 03/09/2011   BILITOT 0.5 03/09/2011    Patient Active Problem List  Diagnosis  . HYPERLIPIDEMIA  . HYPERTENSION  . MENOPAUSAL SYNDROME  . GASTROINTESTINAL HEMORRHAGE, HX OF  . Anorexia  . Medicare annual wellness visit, initial  . Memory loss  . UTI (lower urinary tract infection)  . Dizziness  . Bilateral venous insufficiency   Past Medical History  Diagnosis Date  . H/O: GI bleed 1993; 12/99  . Other and unspecified hyperlipidemia 1991  . History of hypertension 1997  . History of UTI   . Dementia 06/2011    MMSE 14/30, thought senial although fmhx alz type; trial of aricept   Past Surgical History  Procedure Date  . Ankle fracture surgery 1993  . Sigmoidoscopy 06/21/96  . Shoulder surgery 12/09    Right; fracture--Dr. Lenard Forth   History  Substance Use Topics  . Smoking status:  Never Smoker   . Smokeless tobacco: Not on file  . Alcohol Use: No   Family History  Problem Relation Age of Onset  . Other Father     stomach problem; terminal stomach pain  . Other Father     "lost his mind"  . Alzheimer's disease Mother   . Cancer Brother   . Cancer Sister   . Dementia Sister   . Hypertension Brother   . Hypertension Sister    No Known Allergies Current Outpatient Prescriptions on File Prior to Visit  Medication Sig Dispense Refill  . hydrochlorothiazide (HYDRODIURIL) 12.5 MG tablet Take 1 tablet (12.5 mg total) by mouth daily.  30 tablet  0  . Multiple Vitamins-Minerals (MULTIVITAMIN PO) Take 1 tablet by mouth daily.         Review of Systems Review of Systems  Constitutional: Negative for fever, appetite change, fatigue and unexpected weight change.  Eyes: Negative for pain and visual disturbance.  Respiratory: Negative for cough and shortness of breath.   Cardiovascular: Negative for cp or palpitations   neg for  PND or orthopnea  Gastrointestinal: Negative for nausea, diarrhea and constipation.  Genitourinary: Negative for urgency and frequency.  Skin: Negative for pallor or rash   Neurological: Negative for weakness, light-headedness, numbness and headaches.  Hematological: Negative for adenopathy. Does not bruise/bleed easily.  Psychiatric/Behavioral: Negative for dysphoric mood. The patient is not nervous/anxious.  pos for memory loss/ confusion from dementia        Objective:   Physical Exam  Constitutional: She appears well-developed and well-nourished. No distress.       Well appearing elderly female in no distress   Eyes: Conjunctivae and EOM are normal. Pupils are equal, round, and reactive to light.  Neck: Normal range of motion. Neck supple. No JVD present. No thyromegaly present.  Cardiovascular: Normal rate, regular rhythm, normal heart sounds and intact distal pulses.  Exam reveals no gallop.   Pulmonary/Chest: Effort normal and breath  sounds normal. No respiratory distress. She has no wheezes. She has no rales.       No crackles   Abdominal: Soft. Bowel sounds are normal. She exhibits no distension and no mass. There is no tenderness.  Musculoskeletal: She exhibits edema.       bilat LE with varicosities to knee- no ulceration Mild hyperemia/ warmth- without rash or skin breakdown Trace edema without pitting  Non tender No palp cords Neg homan sign  Neurological: She is alert. She has normal reflexes. She exhibits normal muscle tone.  Skin: Skin is warm and dry. No rash noted. There is erythema. No pallor.  Psychiatric: She has a normal mood and affect.          Assessment & Plan:

## 2011-09-01 NOTE — Patient Instructions (Addendum)
I think venous insufficiency (varicose veins) are causing some of the swelling and redness of legs I recommend avoiding sodium, drinking more water, elevating feet when sitting , and remaining active Try some knee high stockings with support from a dept store and see if this helps  If worse or any shortness of breath or other new symptoms - call please Follow up with DR G in about 3 months

## 2011-09-10 ENCOUNTER — Other Ambulatory Visit: Payer: Self-pay | Admitting: Family Medicine

## 2011-12-17 ENCOUNTER — Ambulatory Visit (INDEPENDENT_AMBULATORY_CARE_PROVIDER_SITE_OTHER): Payer: MEDICARE

## 2011-12-17 DIAGNOSIS — Z23 Encounter for immunization: Secondary | ICD-10-CM

## 2012-04-19 ENCOUNTER — Other Ambulatory Visit: Payer: Self-pay | Admitting: Family Medicine

## 2012-06-05 ENCOUNTER — Encounter: Payer: Self-pay | Admitting: Family Medicine

## 2012-06-05 ENCOUNTER — Ambulatory Visit (INDEPENDENT_AMBULATORY_CARE_PROVIDER_SITE_OTHER): Payer: MEDICARE | Admitting: Family Medicine

## 2012-06-05 VITALS — BP 116/74 | HR 88 | Temp 98.1°F | Wt 127.5 lb

## 2012-06-05 DIAGNOSIS — R1013 Epigastric pain: Secondary | ICD-10-CM

## 2012-06-05 NOTE — Progress Notes (Addendum)
  Subjective:    Patient ID: Rhonda Foley, female    DOB: 1916-06-01, 79 y.o.   MRN: [id number]  HPI CC: epigastric pain  Presents with daughter in law.  Yesterday had sudden onset epigastric abd pain that lasted 30 min, then slowly went away.  This had occurred in past - 4 separate episodes over the last week.  Described as soreness and pain in lower abdomen as well.  Some nausea associated with this pain.  Had eaten hamburger prior to soreness.  Appetite down.  No recent change in diet.  Worried about increased distension in abdomen - went from size 10 to 12 in clothes.  Has companion 3d/wk, 3 hours per day (niece). Short term memory gone. Doesn't think any diarrhea or constipation.  Passing gas fine.  Did see Dr. Andee Poles a year ago for persistent clearing of throat.  prilosec trial didn't really affect change  Wt Readings from Last 3 Encounters:  06/05/12 127 lb 8 oz (57.834 kg)  09/01/11 125 lb 12 oz (57.04 kg)  07/28/11 122 lb 12 oz (55.679 kg)    Past Medical History  Diagnosis Date  . H/O: GI bleed 1993; 12/99  . Other and unspecified hyperlipidemia 1991  . History of hypertension 1997  . History of UTI   . Dementia 06/2011    MMSE 14/30, thought senial although fmhx alz type; trial of aricept   Past Surgical History  Procedure Laterality Date  . Ankle fracture surgery  1993  . Sigmoidoscopy  06/21/96  . Shoulder surgery  12/09    Right; fracture--Dr. Lenard Forth    Review of Systems Per HPI     Objective:   Physical Exam  Nursing note and vitals reviewed. Constitutional: She appears well-developed and well-nourished. No distress.  HENT:  Head: Normocephalic and atraumatic.  Mouth/Throat: Oropharynx is clear and moist. No oropharyngeal exudate.  Eyes: Conjunctivae and EOM are normal. Pupils are equal, round, and reactive to light.  Anisocoria  Neck: Normal range of motion. Neck supple.  Cardiovascular: Normal rate, regular rhythm, normal heart sounds and intact  distal pulses.   No murmur heard. Pulmonary/Chest: Effort normal and breath sounds normal. No respiratory distress. She has no wheezes. She has no rales.  Abdominal: Soft. Normal appearance and bowel sounds are normal. She exhibits distension (mild). She exhibits no mass. There is no hepatosplenomegaly. There is no tenderness. There is no rigidity, no rebound, no guarding, no CVA tenderness and negative Murphy's sign. No hernia.  No tenderness throughout No abd or renal bruit appreciated  Musculoskeletal: She exhibits no edema.  Skin: Skin is warm and dry. No rash noted.  Psychiatric: She has a normal mood and affect.       Assessment & Plan:

## 2012-06-05 NOTE — Patient Instructions (Signed)
This could be gastritis.  Treat with prilosec one pill daily for 3 weeks. If persistent despite this, return for lab visit and we may obtain imaging study.  Call me if this happens. Good to see you today!  Call us with questions.

## 2012-06-05 NOTE — Assessment & Plan Note (Addendum)
Some difficulty with story 2/2 dementia. New epigastric abd pain of 1 wk duration. In h/o GI bleed. ?gastritis. abd exam benign today. Treat with prilosec otc 20mg  daily for next 3 weeks, update if sxs persist for further eval (CMP, CBC, lipase and consider abd [place] vs CT). No evidence of obstruction today.

## 2012-08-29 ENCOUNTER — Ambulatory Visit (INDEPENDENT_AMBULATORY_CARE_PROVIDER_SITE_OTHER): Payer: MEDICARE | Admitting: Family Medicine

## 2012-08-29 ENCOUNTER — Encounter: Payer: Self-pay | Admitting: Family Medicine

## 2012-08-29 VITALS — BP 112/68 | HR 84 | Temp 98.1°F | Wt 126.8 lb

## 2012-08-29 DIAGNOSIS — R05 Cough: Secondary | ICD-10-CM

## 2012-08-29 DIAGNOSIS — R110 Nausea: Secondary | ICD-10-CM

## 2012-08-29 DIAGNOSIS — R140 Abdominal distension (gaseous): Secondary | ICD-10-CM

## 2012-08-29 DIAGNOSIS — R143 Flatulence: Secondary | ICD-10-CM

## 2012-08-29 DIAGNOSIS — N390 Urinary tract infection, site not specified: Secondary | ICD-10-CM

## 2012-08-29 LAB — POCT URINALYSIS DIPSTICK
Nitrite, UA: NEGATIVE
Spec Grav, UA: 1.02
Urobilinogen, UA: 0.2
pH, UA: 6

## 2012-08-29 MED ORDER — CIPROFLOXACIN HCL 250 MG PO TABS: 250.0000 mg | ORAL_TABLET | Freq: Two times a day (BID) | ORAL | Status: DC

## 2012-08-29 NOTE — Progress Notes (Signed)
  Subjective:    Patient ID: Rhonda Foley, female    DOB: 1916/03/08, 79 y.o.   MRN: [id number]  HPI CC: not feeling well  Presents with daughter in law.  Keeping persistent cough. So far have tried allegra and claritin.  Keeps cough drop in her mouth.  Has seen ENT (Dr. Andee Poles) - told no reflux.  Trial of prilosec didn't help.    Noting worsening nausea in mornings.  Occasional gagging and vomiting in am.  Epigastric abd pain improved with prilosec 20mg   Also noted LLQ abd pain.   Denies blood in stool.  No diarrhea/constipation, fevers/chills. Noticing abdominal distension - having to buy new clothes. Appetite decreased. Drinking has decreased. Denies dysuria, urgency.  Caregiver 3d/wk at home with her.  Some difficulty with story 2/2 dementia.  Wt Readings from Last 3 Encounters:  08/29/12 126 lb 12 oz (57.493 kg)  06/05/12 127 lb 8 oz (57.834 kg)  09/01/11 125 lb 12 oz (57.04 kg)   Past Medical History  Diagnosis Date  . H/O: GI bleed 1993; 12/99  . Other and unspecified hyperlipidemia 1991  . History of hypertension 1997  . History of UTI   . Dementia 06/2011    MMSE 14/30, thought senial although fmhx alz type; trial of aricept    Past Surgical History  Procedure Laterality Date  . Ankle fracture surgery  1993  . Sigmoidoscopy  06/21/96  . Shoulder surgery  12/09    Right; fracture--Dr. Lenard Forth    Review of Systems Per HPI    Objective:   Physical Exam  Nursing note and vitals reviewed. Constitutional: She appears well-developed and well-nourished. No distress.  HENT:  Mouth/Throat: Oropharynx is clear and moist. No oropharyngeal exudate.  Eyes: Conjunctivae and EOM are normal. Pupils are equal, round, and reactive to light. No scleral icterus.  Cardiovascular: Normal rate, regular rhythm, normal heart sounds and intact distal pulses.   No murmur heard. Pulmonary/Chest: Effort normal and breath sounds normal. No respiratory distress. She has no wheezes.  She has no rales.  Abdominal: Soft. Normal appearance. She exhibits no distension and no mass. Bowel sounds are increased. There is no hepatosplenomegaly. There is no tenderness. There is no rigidity, no rebound, no guarding, no CVA tenderness and negative Murphy's sign.  Musculoskeletal: She exhibits edema (tr pedal edema).  Skin: Skin is warm and dry.  Psychiatric: She has a normal mood and affect.      Assessment & Plan:

## 2012-08-29 NOTE — Assessment & Plan Note (Signed)
Not consistent with allergic, or with GERD related - never improved with prilosec. Difficult to get clear picture given dementia. Lungs clear today, good O2 sat today. Suggested she try guaifenesin immediate release as needed.

## 2012-08-29 NOTE — Assessment & Plan Note (Addendum)
Unclear if truly having regular bowel movements. Given this has been ongoing for last several months, as well as difficulty with true story 2/2 dementia, I recommended blood work today to further evaluate abd discomfort/nausea. Daughter agrees with plan. If persistent despite treatment with cipro, I asked them to contact me for abd [place].

## 2012-08-29 NOTE — Assessment & Plan Note (Addendum)
UA suspicious for infection - along with nausea and recent abd discomfort Will treat for UTI with 5d course cipro. Update if sxs persist past this. UCx sent.

## 2012-08-29 NOTE — Patient Instructions (Addendum)
I'd like to treat for urinary infection with 5d course of cipro.  This has been sent to pharmacy. I'd like to check blood work today.  May discuss ultrasound to further evaluate nausea if treatment with cipro does not resolve nausea. For cough - may try immediate release guaifenesin with plenty of water to try and mobilize mucous - see if this will help.

## 2012-08-30 LAB — COMPREHENSIVE METABOLIC PANEL
AST: 24 U/L (ref 0–37)
Albumin: 3.8 g/dL (ref 3.5–5.2)
Alkaline Phosphatase: 61 U/L (ref 39–117)
BUN: 12 mg/dL (ref 6–23)
Calcium: 9.6 mg/dL (ref 8.4–10.5)
Creatinine, Ser: 0.8 mg/dL (ref 0.4–1.2)
Glucose, Bld: 115 mg/dL — ABNORMAL HIGH (ref 70–99)
Potassium: 3.6 mEq/L (ref 3.5–5.1)

## 2012-08-30 LAB — CBC WITH DIFFERENTIAL/PLATELET
Basophils Relative: 0.6 % (ref 0.0–3.0)
Eosinophils Relative: 2.7 % (ref 0.0–5.0)
Hemoglobin: 12.8 g/dL (ref 12.0–15.0)
Lymphocytes Relative: 25.9 % (ref 12.0–46.0)
MCHC: 34.3 g/dL (ref 30.0–36.0)
Neutro Abs: 4.5 10*3/uL (ref 1.4–7.7)
Neutrophils Relative %: 57.7 % (ref 43.0–77.0)
RBC: 4.24 Mil/uL (ref 3.87–5.11)
WBC: 7.7 10*3/uL (ref 4.5–10.5)

## 2012-08-31 LAB — URINE CULTURE: Colony Count: 50000

## 2012-11-02 ENCOUNTER — Ambulatory Visit (INDEPENDENT_AMBULATORY_CARE_PROVIDER_SITE_OTHER): Payer: Medicare Other | Admitting: Family Medicine

## 2012-11-02 ENCOUNTER — Encounter: Payer: Self-pay | Admitting: Family Medicine

## 2012-11-02 VITALS — BP 114/62 | HR 84 | Temp 98.1°F | Wt 123.5 lb

## 2012-11-02 DIAGNOSIS — R109 Unspecified abdominal pain: Secondary | ICD-10-CM

## 2012-11-02 DIAGNOSIS — R10A1 Flank pain, right side: Secondary | ICD-10-CM | POA: Insufficient documentation

## 2012-11-02 LAB — POCT URINALYSIS DIPSTICK
Glucose, UA: NEGATIVE
pH, UA: 6

## 2012-11-02 MED ORDER — CIPROFLOXACIN HCL 250 MG PO TABS: 250.0000 mg | ORAL_TABLET | Freq: Two times a day (BID) | ORAL | Status: DC

## 2012-11-02 NOTE — Progress Notes (Signed)
  Subjective:    Patient ID: Rhonda Foley, female    DOB: 01-31-1917, 79 y.o.   MRN: [id number]  HPI CC: R flank pain over last 1-2 days  Poor historian 2/2 dementia.  Presents with DIL.  R flank pain worse with movement.  Unsure about urinary changes.  Strong urine odor noted a few days ago.  Drinking lots of sodas (Dr. Reino Kent).  Cannot describe pain. Denies fevers/chills, no diarrhea.  No abd pain. Appetite down  Possibly worsening confusion over last few days as well.  Last UTI 08/29/2012 - Ecoli 50k treated with 5d course of cipro, and sxs did improve.  Wt Readings from Last 3 Encounters:  11/02/12 123 lb 8 oz (56.019 kg)  08/29/12 126 lb 12 oz (57.493 kg)  06/05/12 127 lb 8 oz (57.834 kg)    Past Medical History  Diagnosis Date  . H/O: GI bleed 1993; 12/99  . Other and unspecified hyperlipidemia 1991  . History of hypertension 1997  . History of UTI   . Dementia 06/2011    MMSE 14/30, thought senial although fmhx alz type; trial of aricept     Review of Systems Per HPI    Objective:   Physical Exam  Nursing note and vitals reviewed. Constitutional: She appears well-developed and well-nourished. No distress.  HENT:  Mouth/Throat: Oropharynx is clear and moist. No oropharyngeal exudate.  Cardiovascular: Normal rate, regular rhythm and intact distal pulses.   Murmur (mild systolic) heard. Pulmonary/Chest: Effort normal and breath sounds normal. No respiratory distress. She has no wheezes. She has no rales.  Abdominal: Soft. Normal appearance and bowel sounds are normal. She exhibits no distension and no mass. There is no hepatosplenomegaly. There is no tenderness. There is no rigidity, no rebound, no guarding, no CVA tenderness and negative Murphy's sign.  Musculoskeletal:  No midline spine tenderness No paraspinous mm tenderness  Skin: Skin is warm and dry. No rash noted.      Assessment & Plan:

## 2012-11-02 NOTE — Patient Instructions (Signed)
I do think there's urine infection Treat with antibiotic twice daily for 3 days. Back off Dr Reino Kent, increase water and my take 1 cup cranberry juice daily. Let us know if persistent symptoms despite treatment.

## 2012-11-02 NOTE — Assessment & Plan Note (Signed)
UA showing large blood and tr LE, but micro overall unrevealing. Given h/o infection, will treat with 3d course cipro and send UCx. Recommended avoid sodas, increase water, may use cranberry juice to help prevent future UTIs Update [place] if persistent sxs after treatment. May also use tylenol prn back discomfort. No rash today - advised to monitor for development of vesciular rash

## 2012-11-08 ENCOUNTER — Telehealth: Payer: Self-pay | Admitting: *Deleted

## 2012-11-08 DIAGNOSIS — R109 Unspecified abdominal pain: Secondary | ICD-10-CM

## 2012-11-08 NOTE — Telephone Encounter (Signed)
Any other symptoms like urinary difficulty, fever, nausea? Let's have her try heating pad onto skin on back - for possible muscle spasm.   I will also order abd [place] - placed order in chart.

## 2012-11-08 NOTE — Telephone Encounter (Signed)
Patient's daughter denied urinary symptoms, fever or nausea, but said caregiver reported that she seems to be favoring that side a lot more and walking different. She will have her use the heating pad and get the abd [place] as you have instructed, but tends to think it's more back related now.

## 2012-11-08 NOTE — Telephone Encounter (Signed)
Patient's daughter called and side pain seems to be getting worse. Tylenol is not helping and she was wondering if she may need an xray or further eval. You don't have any openings today. Do you need to see her again or is there something else you want her to try?

## 2012-11-08 NOTE — Telephone Encounter (Signed)
Noted  

## 2012-11-10 ENCOUNTER — Ambulatory Visit: Payer: Self-pay | Admitting: Family Medicine

## 2012-11-13 ENCOUNTER — Encounter: Payer: Self-pay | Admitting: Family Medicine

## 2012-11-13 ENCOUNTER — Telehealth: Payer: Self-pay | Admitting: Family Medicine

## 2012-11-13 NOTE — Telephone Encounter (Signed)
plz notify patient's daughter in law - ultrasound showing benign kidney and liver cysts, otherwise normal. Nothing found to cause R flank pain.  How is pain?

## 2012-11-14 NOTE — Telephone Encounter (Signed)
Rhonda Foley said today the pain is better and pt does not need acetaminophen today. Rhonda Foley will update Dr Reece Agar if pain returns.

## 2013-04-01 DIAGNOSIS — M8580 Other specified disorders of bone density and structure, unspecified site: Secondary | ICD-10-CM

## 2013-04-01 DIAGNOSIS — J449 Chronic obstructive pulmonary disease, unspecified: Secondary | ICD-10-CM

## 2013-04-01 DIAGNOSIS — S22000A Wedge compression fracture of unspecified thoracic vertebra, initial encounter for closed fracture: Secondary | ICD-10-CM

## 2013-04-01 DIAGNOSIS — I517 Cardiomegaly: Secondary | ICD-10-CM

## 2013-04-01 HISTORY — DX: Cardiomegaly: I51.7

## 2013-04-01 HISTORY — DX: Wedge compression fracture of unspecified thoracic vertebra, initial encounter for closed fracture: S22.000A

## 2013-04-01 HISTORY — DX: Chronic obstructive pulmonary disease, unspecified: J44.9

## 2013-04-01 HISTORY — DX: Other specified disorders of bone density and structure, unspecified site: M85.80

## 2013-04-09 ENCOUNTER — Ambulatory Visit (INDEPENDENT_AMBULATORY_CARE_PROVIDER_SITE_OTHER): Payer: Medicare Other | Admitting: Family Medicine

## 2013-04-09 ENCOUNTER — Encounter: Payer: Self-pay | Admitting: Family Medicine

## 2013-04-09 ENCOUNTER — Ambulatory Visit (INDEPENDENT_AMBULATORY_CARE_PROVIDER_SITE_OTHER)
Admission: RE | Admit: 2013-04-09 | Discharge: 2013-04-09 | Disposition: A | Discharge status: Home / Self Care | Payer: Medicare Other | Source: Ambulatory Visit | Attending: Family Medicine | Admitting: Family Medicine

## 2013-04-09 VITALS — BP 134/82 | HR 80 | Temp 98.0°F | Wt 125.5 lb

## 2013-04-09 DIAGNOSIS — R05 Cough: Secondary | ICD-10-CM

## 2013-04-09 DIAGNOSIS — R059 Cough, unspecified: Secondary | ICD-10-CM

## 2013-04-09 DIAGNOSIS — R42 Dizziness and giddiness: Secondary | ICD-10-CM

## 2013-04-09 NOTE — Progress Notes (Signed)
Pre-visit discussion using our clinic review tool. No additional management support is needed unless otherwise documented below in the visit note.  

## 2013-04-09 NOTE — Patient Instructions (Addendum)
Blood pressures and xray looking ok today. Try to increase water you drink daily. May try prescription low-compression stockings or over the counter support hose. If persistent cough or dizziness despite compression stocking, let me know for blood work. Good to see you today! Call us with questions.

## 2013-04-09 NOTE — Assessment & Plan Note (Addendum)
Check CXR today to evaluate longstanding cough and abnormal breath sounds (diffuse crackles). CXR overall unrevealing today - ?some cardiomegaly. Will treat with low-compression stockings. If not improved with this, consider BNP and ultimately echo. Discussed all this with DIL. Continue current dose of HCTZ for now.

## 2013-04-09 NOTE — Progress Notes (Addendum)
   BP 134/82  Pulse 80  Temp(Src) 98 F (36.7 C) (Oral)  Wt 125 lb 8 oz (56.926 kg)   CC: discuss BP and dizziness  Subjective:    Patient ID: Rhonda Foley, female    DOB: 1917-02-02, 80 y.o.   MRN: [id number]  HPI: Rhonda Foley is a 80 y.o. female presenting on 04/09/2013 with Hypertension   Rhonda Foley presents with daughter in law today.  Story complicated by dementia.  Last week she mentioned to her DIL and caregiver that she was staying dizzy.  Some elevated BP readings over the last week - 180/80-90s (mornings).  Yesterday BP 144/84.  This morning 124/84.  Noticing increased swelling in legs as well.  Dizziness tends to be more in the mornings.  Using electronic cuff at home.  However, has also had high readings with home manual cuff.  She does have ongoing episodes of coughing - claritin, allegra and zyrtec have not helped.   BP Readings from Last 3 Encounters:  04/09/13 134/82  11/02/12 114/62  08/29/12 112/68   Wt Readings from Last 3 Encounters:  04/09/13 125 lb 8 oz (56.926 kg)  11/02/12 123 lb 8 oz (56.019 kg)  08/29/12 126 lb 12 oz (57.493 kg)  Body mass index is 28.15 kg/(m^2).  Relevant past medical, surgical, family and social history reviewed and updated. Allergies and medications reviewed and updated. Current Outpatient Prescriptions on File Prior to Visit  Medication Sig  . hydrochlorothiazide (MICROZIDE) 12.5 MG capsule TAKE ONE CAPSULE BY MOUTH DAILY  . Multiple Vitamins-Minerals (MULTIVITAMIN PO) Take 1 tablet by mouth daily.   No current facility-administered medications on file prior to visit.    Review of Systems Per HPI unless specifically indicated above    Objective:    BP 134/82  Pulse 80  Temp(Src) 98 F (36.7 C) (Oral)  Wt 125 lb 8 oz (56.926 kg)  Physical Exam  Nursing note and vitals reviewed. Constitutional: She appears well-developed and well-nourished. No distress.  HENT:  Mouth/Throat: Oropharynx is clear and moist. No  oropharyngeal exudate.  Cardiovascular: Normal rate, normal heart sounds and intact distal pulses.   No murmur heard. Slightly irregular  Pulmonary/Chest: Effort normal. No respiratory distress. She has no decreased breath sounds. She has no wheezes. She has no rhonchi. She has no rales.  Crackles throughout L>R  Musculoskeletal: She exhibits edema (tr pedal R>L).      Assessment & Plan:   Problem List Items Addressed This Visit   Cough - Primary     Check CXR today to evaluate longstanding cough and abnormal breath sounds (diffuse crackles). CXR overall unrevealing today - ?some cardiomegaly. Will treat with low-compression stockings. If not improved with this, consider BNP and ultimately echo. Discussed all this with DIL. Continue current dose of HCTZ for now.    Relevant Orders      DG Chest 2 View   Dizziness     Orthostatics ok - DIL doesn't think she stays as hydrated as she should. Encouraged increased fluid intake during the day.        Follow up plan: Return as needed.

## 2013-04-09 NOTE — Assessment & Plan Note (Signed)
Orthostatics ok - DIL doesn't think she stays as hydrated as she should. Encouraged increased fluid intake during the day.

## 2013-04-11 ENCOUNTER — Encounter: Payer: Self-pay | Admitting: Family Medicine

## 2013-05-14 ENCOUNTER — Other Ambulatory Visit: Payer: Self-pay | Admitting: Family Medicine

## 2013-06-07 ENCOUNTER — Ambulatory Visit: Payer: Medicare Other | Admitting: Family Medicine

## 2013-06-08 ENCOUNTER — Encounter: Payer: Self-pay | Admitting: Family Medicine

## 2013-06-08 ENCOUNTER — Ambulatory Visit (INDEPENDENT_AMBULATORY_CARE_PROVIDER_SITE_OTHER): Payer: Medicare Other | Admitting: Family Medicine

## 2013-06-08 VITALS — BP 124/82 | HR 81 | Temp 98.4°F | Wt 130.2 lb

## 2013-06-08 DIAGNOSIS — R05 Cough: Secondary | ICD-10-CM

## 2013-06-08 DIAGNOSIS — R059 Cough, unspecified: Secondary | ICD-10-CM

## 2013-06-08 NOTE — Progress Notes (Signed)
Pre visit review using our clinic review tool, if applicable. No additional management support is needed unless otherwise documented below in the visit note. 

## 2013-06-08 NOTE — Assessment & Plan Note (Signed)
Recent CXR with ?COPD and CM - but doubt cough coming from there. No evidence of infection on exam today. ?mild pulmonary fibrosis given lung findings, discussed concern with DIL - discussed option of CT scan for further evaluation but pt overall doing well.  DIL agrees with deferring further workup for now. Suggested dimetapp prn cough in am. Update if worsening cough or dyspnea or fever. Consider BNP next blood work.

## 2013-06-08 NOTE — Progress Notes (Signed)
BP 124/82  Pulse 81  Temp(Src) 98.4 F (36.9 C) (Oral)  Wt 130 lb 4 oz (59.081 kg)  SpO2 97%   CC: cough  Subjective:    Patient ID: Rhonda Foley, female    DOB: 1916-05-19, 80 y.o.   MRN: [id number]  HPI: Rhonda Foley is a 80 y.o. female presenting on 06/08/2013 for Cough   [name] presents with daughter in law today. Story complicated by dementia. See prior note for details: She does have ongoing episodes of coughing - claritin, allegra and zyrtec have not helped. Cough sounds productive.  Worse in the morning.  Daughter in law notices some dyspnea with walking.  Mild pedal edema. Last visit we recommended compression stockings for leg swelling.  ?mild CHF based on cardiomegaly.  COPD was also found.  Pt never smoked nor was exposed to 2nd hand smoke.  Did work in [employer] ill for 56yrs.  ? Chemical exposure there. denies GERD or abd discomfort.  Denies any dizziness.  Did have a fall in the house last week.  CHEST 2 VIEW: COMPARISON: None.  FINDINGS:  The cardiac silhouette is enlarged. No focal regions of  consolidation or focal infiltrates. There is flattening of the  hemidiaphragms is well as increased AP diameter chest. The bones are  osteopenic. Chronic compression deformities appreciated within the  mid thoracic spine.  IMPRESSION:  COPD without evidence of acute cardiopulmonary disease.  Electronically Signed  By: Margaree Mackintosh M.D.  On: 04/09/2013 16:14  Wt Readings from Last 3 Encounters:  06/08/13 130 lb 4 oz (59.081 kg)  04/09/13 125 lb 8 oz (56.926 kg)  11/02/12 123 lb 8 oz (56.019 kg)    Relevant past medical, surgical, family and social history reviewed and updated as indicated.  Allergies and medications reviewed and updated. Current Outpatient Prescriptions on File Prior to Visit  Medication Sig  . hydrochlorothiazide (MICROZIDE) 12.5 MG capsule TAKE ONE CAPSULE BY MOUTH DAILY  . Multiple Vitamins-Minerals (MULTIVITAMIN PO) Take 1 tablet by  mouth daily.   No current facility-administered medications on file prior to visit.    Review of Systems Per HPI unless specifically indicated above    Objective:    BP 124/82  Pulse 81  Temp(Src) 98.4 F (36.9 C) (Oral)  Wt 130 lb 4 oz (59.081 kg)  SpO2 97%  Physical Exam  Nursing note and vitals reviewed. Constitutional: She appears well-developed and well-nourished. No distress.  HENT:  Mouth/Throat: Oropharynx is clear and moist. No oropharyngeal exudate.  Eyes: Conjunctivae and EOM are normal. Pupils are equal, round, and reactive to light.  Neck: No hepatojugular reflux and no JVD present.  Cardiovascular: Normal rate, regular rhythm, normal heart sounds and intact distal pulses.   No murmur heard. Pulmonary/Chest: Effort normal and breath sounds normal. No respiratory distress. She has no wheezes. She has no rales.  Crackles throughout bilateral lungs  Musculoskeletal: She exhibits no edema (nonpitting edema).       Assessment & Plan:   Problem List Items Addressed This Visit   Cough - Primary     Recent CXR with ?COPD and CM - but doubt cough coming from there. No evidence of infection on exam today. ?mild pulmonary fibrosis given lung findings, discussed concern with DIL - discussed option of CT scan for further evaluation but pt overall doing well.  DIL agrees with deferring further workup for now. Suggested dimetapp prn cough in am. Update if worsening cough or dyspnea or fever. Consider BNP next blood work.  Follow up plan: Return if symptoms worsen or fail to improve.

## 2013-06-08 NOTE — Patient Instructions (Addendum)
I think lungs are doing ok. Continue water pill daily. I wonder about possible pulmonary fibrosis. I don't see infection today. May try delsym or dimetapp over the counter Let me know if fever with cough or worsening shortness of breath with exertion.

## 2013-07-16 ENCOUNTER — Other Ambulatory Visit: Payer: Self-pay | Admitting: *Deleted

## 2013-07-16 MED ORDER — HYDROCHLOROTHIAZIDE 12.5 MG PO CAPS: ORAL_CAPSULE | ORAL | Status: DC

## 2013-07-19 ENCOUNTER — Other Ambulatory Visit: Payer: Self-pay | Admitting: *Deleted

## 2013-07-19 MED ORDER — HYDROCHLOROTHIAZIDE 12.5 MG PO CAPS: ORAL_CAPSULE | ORAL | Status: DC

## 2013-10-16 ENCOUNTER — Other Ambulatory Visit: Payer: Self-pay | Admitting: Family Medicine

## 2014-01-21 ENCOUNTER — Other Ambulatory Visit: Payer: Self-pay | Admitting: Family Medicine

## 2014-04-01 HISTORY — PX: US ECHOCARDIOGRAPHY: HXRAD669

## 2014-04-05 ENCOUNTER — Encounter: Payer: Self-pay | Admitting: Family Medicine

## 2014-04-05 ENCOUNTER — Ambulatory Visit (INDEPENDENT_AMBULATORY_CARE_PROVIDER_SITE_OTHER): Payer: Medicare Other | Admitting: Family Medicine

## 2014-04-05 VITALS — BP 118/74 | HR 96 | Temp 97.6°F | Wt 134.2 lb

## 2014-04-05 DIAGNOSIS — I10 Essential (primary) hypertension: Secondary | ICD-10-CM

## 2014-04-05 DIAGNOSIS — Z23 Encounter for immunization: Secondary | ICD-10-CM

## 2014-04-05 DIAGNOSIS — I5189 Other ill-defined heart diseases: Secondary | ICD-10-CM | POA: Insufficient documentation

## 2014-04-05 DIAGNOSIS — R410 Disorientation, unspecified: Secondary | ICD-10-CM

## 2014-04-05 DIAGNOSIS — I517 Cardiomegaly: Secondary | ICD-10-CM

## 2014-04-05 DIAGNOSIS — R0600 Dyspnea, unspecified: Secondary | ICD-10-CM

## 2014-04-05 DIAGNOSIS — F0390 Unspecified dementia without behavioral disturbance: Secondary | ICD-10-CM

## 2014-04-05 LAB — POCT URINALYSIS DIPSTICK
BILIRUBIN UA: NEGATIVE
GLUCOSE UA: NEGATIVE
Ketones, UA: NEGATIVE
Nitrite, UA: NEGATIVE
PROTEIN UA: NEGATIVE
Spec Grav, UA: >=1.03
Urobilinogen, UA: 0.2
pH, UA: 6

## 2014-04-05 NOTE — Assessment & Plan Note (Addendum)
Crackles bibasilarly. No significant evidence of fluid overload today. Anticipate due to deconditioning. DIL agrees. Given her history of cardiomegaly by CXR will check echo to eval LV function - and see if would benefit from lasix.

## 2014-04-05 NOTE — Assessment & Plan Note (Signed)
Actually blood pressure wonderful today - doubt we need hctz - will discontinue.

## 2014-04-05 NOTE — Patient Instructions (Addendum)
Pass by Marion's office for referral for ultrasound of heart. Let's stop hydrochlorothiazide for now. We will call you with results of ultrasound. Let us know if confusion returning or any urinary symptoms. Return as needed. Good to see you today, call us with questions.

## 2014-04-05 NOTE — Progress Notes (Signed)
Pre visit review using our clinic review tool, if applicable. No additional management support is needed unless otherwise documented below in the visit note. 

## 2014-04-05 NOTE — Progress Notes (Signed)
BP 118/74 mmHg  Pulse 96  Temp(Src) 97.6 F (36.4 C) (Oral)  Wt 134 lb 4 oz (60.895 kg)  SpO2 90%   CC: AMS, weight gain  Subjective:    Patient ID: Rhonda Foley, female    DOB: 05-20-1916, 81 y.o.   MRN: [id number]  HPI: Rhonda Foley is a 81 y.o. female presenting on 04/05/2014 for Altered Mental Status and Weight Gain   Here for checkup and flu shot. Presents with DIL caregiver.  Occasionally confusion worsens such as last week but this week doing better.  H/o UTI last year, drinking cranberry juice daily, as well as increased water since then. They have decreased sodas and tea. Family likes taking her out to eat.   Recent URI over last 1+ month, treated with mucinex and sxs improved. Mild nagging cough and rhinorrhea (claritin/allegra and zyrtec not very helpful). Denies fevers/chills, dyspnea, or wheezing. No urinary symptoms of dysuria, urgency, frequency or increased incontinence.  Stays short winded possibly from deconditioning. Minimal leg swelling. Known COPD by CXR last year, as well as known cardiomegaly by CXR last year.   No problem with stooling. Controls bladder well. Some leaking at night time. Short term memory gone  Lives at home. Niece comes 3 hours 5d/wk and DIL helps out rest of day  Family cooks for her.  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  Relevant past medical, surgical, family and social history reviewed and updated as indicated. Interim medical history since our last visit reviewed. Allergies and medications reviewed and updated. Current Outpatient Prescriptions on File Prior to Visit  Medication Sig  . Brompheniramine-Pseudoeph (DIMETAPP PO) Take 5 mLs by mouth daily as needed.  . Multiple Vitamins-Minerals (MULTIVITAMIN PO) Take 1 tablet by mouth daily.   No current facility-administered medications on file prior to visit.    Review of Systems Per HPI unless specifically indicated above     Objective:    BP 118/74  mmHg  Pulse 96  Temp(Src) 97.6 F (36.4 C) (Oral)  Wt 134 lb 4 oz (60.895 kg)  SpO2 90%  Wt Readings from Last 3 Encounters:  04/05/14 134 lb 4 oz (60.895 kg)  06/08/13 130 lb 4 oz (59.081 kg)  04/09/13 125 lb 8 oz (56.926 kg)    Physical Exam  Constitutional: She appears well-developed and well-nourished. No distress.  HENT:  Mouth/Throat: Oropharynx is clear and moist. No oropharyngeal exudate.  Eyes: EOM are normal. Pupils are equal, round, and reactive to light. No scleral icterus.  Neck: Normal range of motion. Neck supple. No JVD present. No thyromegaly present.  Cardiovascular: Normal rate, regular rhythm, normal heart sounds and intact distal pulses.   No murmur heard. Pulmonary/Chest: Effort normal and breath sounds normal. No respiratory distress. She has no wheezes. She has no rales.  Bibasilar crackles  Abdominal: Soft. Bowel sounds are normal. She exhibits no distension and no mass. There is no tenderness. There is no rebound and no guarding.  Musculoskeletal: She exhibits edema (tr nonpitting edema).  Lymphadenopathy:    She has no cervical adenopathy.  Skin: Skin is warm and dry. No rash noted.  Psychiatric: She has a normal mood and affect.  Pleasant, cooperative,  mildly demented  Nursing note and vitals reviewed.      Assessment & Plan:   Problem List Items Addressed This Visit    Senile dementia    Mild, stable. Family assists as caregivers. No change in level of care needs.  Essential hypertension    Actually blood pressure wonderful today - doubt we need hctz - will discontinue.      Dyspnea - Primary    Crackles bibasilarly. No significant evidence of fluid overload today. Anticipate due to deconditioning. DIL agrees. Given her history of cardiomegaly by CXR will check echo to eval LV function - and see if would benefit from lasix.      Relevant Orders   2D Echocardiogram without contrast   Confusion    Temporary increase in confusion  last week, this week back to herself. UA abnormal - but anticipate asxs bacteriuria/sterile pyuria, will not culture or treat as she's not having any further sxs and no urinary symptoms whatsoever. Reliable DIL will watch and notify us if new sxs develop.      Relevant Orders   POCT Urinalysis Dipstick (Completed)   Cardiomegaly   Relevant Orders   2D Echocardiogram without contrast    Other Visit Diagnoses    Need for influenza vaccination        Relevant Orders    Flu Vaccine QUAD 36+ mos PF IM (Fluarix Quad PF) (Completed)        Follow up plan: Return in about 6 months (around 10/04/2014), or as needed, for follow up visit.

## 2014-04-05 NOTE — Assessment & Plan Note (Signed)
Temporary increase in confusion last week, this week back to herself. UA abnormal - but anticipate asxs bacteriuria/sterile pyuria, will not culture or treat as she's not having any further sxs and no urinary symptoms whatsoever. Reliable DIL will watch and notify us if new sxs develop.

## 2014-04-05 NOTE — Assessment & Plan Note (Addendum)
Mild, stable. Family assists as caregivers. No change in level of care needs.

## 2014-04-09 ENCOUNTER — Telehealth: Payer: Self-pay | Admitting: Family Medicine

## 2014-04-09 NOTE — Telephone Encounter (Signed)
emmi mailed  °

## 2014-04-18 ENCOUNTER — Other Ambulatory Visit (INDEPENDENT_AMBULATORY_CARE_PROVIDER_SITE_OTHER): Payer: Medicare Other

## 2014-04-18 ENCOUNTER — Other Ambulatory Visit: Payer: Self-pay

## 2014-04-18 DIAGNOSIS — R0600 Dyspnea, unspecified: Secondary | ICD-10-CM

## 2014-04-18 DIAGNOSIS — I517 Cardiomegaly: Secondary | ICD-10-CM

## 2014-04-21 ENCOUNTER — Encounter: Payer: Self-pay | Admitting: Family Medicine

## 2014-04-24 ENCOUNTER — Other Ambulatory Visit: Payer: Self-pay | Admitting: Family Medicine

## 2014-04-29 ENCOUNTER — Telehealth: Payer: Self-pay | Admitting: Family Medicine

## 2014-04-29 ENCOUNTER — Emergency Department: Payer: Self-pay | Admitting: Emergency Medicine

## 2014-04-29 NOTE — Telephone Encounter (Signed)
Noted. plz call tomorrow for update. 

## 2014-04-29 NOTE — Telephone Encounter (Signed)
[place] Patient Name: Rhonda Foley DOB: January 27, 1917 Initial Comment Caller states, mother in law fell this morning, she has pain in left knee and behind the knee. She also hit head and nose, she had a nosebleed, the nose has bruised already. She used ice and the lump on her head has already begun to get better. Nurse Assessment Guidelines Guideline Title Affirmed Question Affirmed Notes Head Injury Can't remember what happened (amnesia) Final Disposition User Go to ED Now Donalynn Furlong, RN, Myna Hidalgo Comments Caller en route to ED now as we speak, as fall was unwitnessed and pt does not recall exactly what happened, with multiple points of injury. Unsure if pt lost consciousness, caller states she is not on blood-thinners

## 2014-04-30 NOTE — Telephone Encounter (Signed)
Spoke with DIL who said xrays and CT all came back negative. She is just bruised today. She appreciated the call. Records requested from [facility].

## 2014-05-11 NOTE — Telephone Encounter (Signed)
Reviewed hospital records - asked to scan Fall with R periorbital bruising. Maxillofacial and head CT with frontal hematoma L knee with small joint effusion but no fracture. Asked to scan imaging.

## 2014-07-16 ENCOUNTER — Ambulatory Visit (INDEPENDENT_AMBULATORY_CARE_PROVIDER_SITE_OTHER): Payer: Medicare Other | Admitting: Family Medicine

## 2014-07-16 ENCOUNTER — Encounter: Payer: Self-pay | Admitting: Family Medicine

## 2014-07-16 VITALS — BP 110/74 | HR 68 | Temp 97.5°F | Wt 136.5 lb

## 2014-07-16 DIAGNOSIS — R404 Transient alteration of awareness: Secondary | ICD-10-CM | POA: Diagnosis not present

## 2014-07-16 DIAGNOSIS — F0390 Unspecified dementia without behavioral disturbance: Secondary | ICD-10-CM | POA: Diagnosis not present

## 2014-07-16 DIAGNOSIS — F05 Delirium due to known physiological condition: Secondary | ICD-10-CM | POA: Diagnosis not present

## 2014-07-16 DIAGNOSIS — R4182 Altered mental status, unspecified: Secondary | ICD-10-CM | POA: Insufficient documentation

## 2014-07-16 LAB — POCT URINALYSIS DIPSTICK
Bilirubin, UA: NEGATIVE
Glucose, UA: NEGATIVE
Ketones, UA: NEGATIVE
NITRITE UA: NEGATIVE
Protein, UA: NEGATIVE
RBC UA: NEGATIVE
Urobilinogen, UA: 0.2
pH, UA: 6

## 2014-07-16 NOTE — Patient Instructions (Addendum)
We will send a urine culture to evaluate for infection and call you with results.  Happy birthday!!

## 2014-07-16 NOTE — Progress Notes (Signed)
Pre visit review using our clinic review tool, if applicable. No additional management support is needed unless otherwise documented below in the visit note. 

## 2014-07-16 NOTE — Progress Notes (Signed)
BP 110/74 mmHg  Pulse 68  Temp(Src) 97.5 F (36.4 C) (Oral)  Wt 136 lb 8 oz (61.916 kg)   CC: AMS  Subjective:    Patient ID: Rhonda Foley, female    DOB: 1917-02-02, 81 y.o.   MRN: [id number]  HPI: Rhonda Foley is a 81 y.o. female presenting on November 17, 202016 for Altered Mental Status   Presents with niece today. Noticing increasing repetition, worsening memory. Yesterday lost teeth. Forgets when she's eaten and wants to eat again right after finishing. All these changes have happened over the last 2 weeks.   Pt denies abd pain, dysuria, fevers or chills. No stool trouble.  Some urinary leaking during day and at night. Not using depens.  On HCTZ because legs started swelling.  Caregiver comes every morning and 3 afternoons per week.   Relevant past medical, surgical, family and social history reviewed and updated as indicated. Interim medical history since our last visit reviewed. Allergies and medications reviewed and updated. Current Outpatient Prescriptions on File Prior to Visit  Medication Sig  . acetaminophen (TYLENOL) 500 MG tablet Take 500 mg by mouth 3 (three) times daily as needed.  . Brompheniramine-Pseudoeph (DIMETAPP PO) Take 5 mLs by mouth daily as needed.  . cetirizine (ZYRTEC) 10 MG tablet Take 10 mg by mouth daily.  Marland Kitchen loratadine (CLARITIN) 10 MG tablet Take 10 mg by mouth daily.  . Multiple Vitamins-Minerals (MULTIVITAMIN PO) Take 1 tablet by mouth daily.   No current facility-administered medications on file prior to visit.    Review of Systems Per HPI unless specifically indicated above     Objective:    BP 110/74 mmHg  Pulse 68  Temp(Src) 97.5 F (36.4 C) (Oral)  Wt 136 lb 8 oz (61.916 kg)  Wt Readings from Last 3 Encounters:  07/16/14 136 lb 8 oz (61.916 kg)  04/05/14 134 lb 4 oz (60.895 kg)  06/08/13 130 lb 4 oz (59.081 kg)    Physical Exam  Constitutional: She appears well-developed and well-nourished. No distress.  HENT:    Mouth/Throat: Oropharynx is clear and moist. No oropharyngeal exudate.  Cardiovascular: Normal rate, regular rhythm and intact distal pulses.   Murmur (mild SEM) heard. Pulmonary/Chest: Effort normal and breath sounds normal. No respiratory distress. She has no wheezes. She has no rales.  Bibasilar crackles  Abdominal: Soft. Normal appearance and bowel sounds are normal. She exhibits no distension and no mass. There is no hepatosplenomegaly. There is no tenderness. There is no rigidity, no rebound, no guarding, no CVA tenderness and negative Murphy's sign.  Musculoskeletal: She exhibits no edema.  Skin: Skin is warm and dry. No rash noted.  Psychiatric: She has a normal mood and affect.  Pleasant   Nursing note and vitals reviewed.  Results for orders placed or performed in visit on 07/16/14  Urinalysis Dipstick  Result Value Ref Range   Color, UA yellow    Clarity, UA clear    Glucose, UA negative    Bilirubin, UA negative    Ketones, UA negative    Spec Grav, UA >=1.030    Blood, UA negative    pH, UA 6.0    Protein, UA negative    Urobilinogen, UA 0.2    Nitrite, UA negative    Leukocytes, UA small (1+)       Assessment & Plan:   Problem List Items Addressed This Visit    Altered mental status - Primary    UA/micro equivocal for infection so will  send culture. Anticipate more related to dementia. Discussed with caregiver.      Relevant Orders   Urine culture   Senile dementia    Other Visit Diagnoses    Acute confusional state        Relevant Orders    Urinalysis Dipstick (Completed)    Urine culture        Follow up plan: Return if symptoms worsen or fail to improve.

## 2014-07-16 NOTE — Addendum Note (Signed)
Addended by: Emelia Salisbury C on: 07/03/202016 02:35 PM   Modules accepted: Orders

## 2014-07-16 NOTE — Assessment & Plan Note (Signed)
UA/micro equivocal for infection so will send culture. Anticipate more related to dementia. Discussed with caregiver.

## 2014-07-18 LAB — URINE CULTURE

## 2014-07-26 ENCOUNTER — Other Ambulatory Visit: Payer: Self-pay | Admitting: Family Medicine

## 2014-10-15 ENCOUNTER — Ambulatory Visit: Payer: Medicare Other | Admitting: Family Medicine

## 2014-10-20 ENCOUNTER — Other Ambulatory Visit: Payer: Self-pay | Admitting: Family Medicine

## 2014-10-24 ENCOUNTER — Encounter: Payer: Self-pay | Admitting: Family Medicine

## 2014-10-24 ENCOUNTER — Ambulatory Visit (INDEPENDENT_AMBULATORY_CARE_PROVIDER_SITE_OTHER): Payer: Medicare Other | Admitting: Family Medicine

## 2014-10-24 VITALS — BP 118/64 | HR 107 | Temp 97.9°F | Wt 135.0 lb

## 2014-10-24 DIAGNOSIS — R0789 Other chest pain: Secondary | ICD-10-CM

## 2014-10-24 DIAGNOSIS — R05 Cough: Secondary | ICD-10-CM | POA: Diagnosis not present

## 2014-10-24 DIAGNOSIS — I10 Essential (primary) hypertension: Secondary | ICD-10-CM | POA: Diagnosis not present

## 2014-10-24 DIAGNOSIS — F0390 Unspecified dementia without behavioral disturbance: Secondary | ICD-10-CM | POA: Diagnosis not present

## 2014-10-24 DIAGNOSIS — M25561 Pain in right knee: Secondary | ICD-10-CM

## 2014-10-24 DIAGNOSIS — R059 Cough, unspecified: Secondary | ICD-10-CM

## 2014-10-24 NOTE — Assessment & Plan Note (Addendum)
Unable to get further history 2/2 dementia. Update EKG today for any changes from last one 06/2011. DIL will continue to monitor this. asxs currently. EKG - NSR rate 75, normal axis, prolonged PR, no acute ST/T changes, no change from prior EKG 2013.

## 2014-10-24 NOTE — Assessment & Plan Note (Signed)
BP wonderful - uses HCTZ more for leg swelling, advised to use PRN.

## 2014-10-24 NOTE — Patient Instructions (Addendum)
EKG today. Change HCTZ to as needed for leg swelling. Try dimetapp for cough. Xray from last year with possible COPD changes to lungs. No breathing medicine for now. Return as needed or in 6 months for medicare wellness visit

## 2014-10-24 NOTE — Assessment & Plan Note (Signed)
Anticipate OA related - continue tylenol.

## 2014-10-24 NOTE — Assessment & Plan Note (Signed)
Persistent. Suggested delsym. Prior workup with CXR with COPD and CM, echo overall stable (increased PA pressures). ?mild pulm fibrosis.

## 2014-10-24 NOTE — Assessment & Plan Note (Signed)
Progression noted. Discussed higher level of care, 24 hour supervision and she may do better.  She has about 8-12 hours of caregiver supervision currently. Aricept ineffective. DIL will discuss with family.

## 2014-10-24 NOTE — Progress Notes (Addendum)
BP 118/64 mmHg  Pulse 107  Temp(Src) 97.9 F (36.6 C) (Oral)  Wt 135 lb (61.236 kg)  SpO2 98%   CC: 44mo f/u visit  Subjective:    Patient ID: Rhonda Foley, female    DOB: [birth year], 81 y.o.   MRN: [id number]  HPI: Rhonda Foley is a 81 y.o. female presenting on 10/24/2014 for Follow-up   Presents with DIL.  See prior note for details - seen here 06/2014 with intermittent confusion with worsening memory loss - UCx negative for infection. Anticipated progression of dementia.   Continues being forgetful of which meals. Stays happy, good appetite.   Persistent cough with fits. Ongoing for years. No new fevers/cihlls. Zyrtec hasn't helped, claritin hasn't helped. Dimetapp doesn't help. No headaches, no back pain. Occasional chest pain. Sedentary lifestyle. Unable to elicit more details regarding chest pain 2/2 dementia.  Having some R knee pain - does not complain. Treating with tylenol BID. Denies inciting trauma/falls. More unsteady gait.   NSAID allergy.  Relevant past medical, surgical, family and social history reviewed and updated as indicated. Interim medical history since our last visit reviewed. Allergies and medications reviewed and updated. Current Outpatient Prescriptions on File Prior to Visit  Medication Sig  . acetaminophen (TYLENOL) 500 MG tablet Take 500 mg by mouth 3 (three) times daily as needed.  . Brompheniramine-Pseudoeph (DIMETAPP PO) Take 5 mLs by mouth daily as needed.  . cetirizine (ZYRTEC) 10 MG tablet Take 10 mg by mouth daily.  . hydrochlorothiazide (MICROZIDE) 12.5 MG capsule TAKE ONE CAPSULE BY MOUTH DAILY  . loratadine (CLARITIN) 10 MG tablet Take 10 mg by mouth daily.  . Multiple Vitamins-Minerals (MULTIVITAMIN PO) Take 1 tablet by mouth daily.   No current facility-administered medications on file prior to visit.    Review of Systems Per HPI unless specifically indicated above     Objective:    BP 118/64 mmHg  Pulse 107  Temp(Src)  97.9 F (36.6 C) (Oral)  Wt 135 lb (61.236 kg)  SpO2 98%  Wt Readings from Last 3 Encounters:  10/24/14 135 lb (61.236 kg)  07/16/14 136 lb 8 oz (61.916 kg)  04/05/14 134 lb 4 oz (60.895 kg)    Physical Exam  Constitutional: She appears well-developed and well-nourished. No distress.  HENT:  Mouth/Throat: Oropharynx is clear and moist. No oropharyngeal exudate.  Eyes: Conjunctivae and EOM are normal. Pupils are equal, round, and reactive to light. No scleral icterus.  Neck: Normal range of motion. Neck supple. No thyromegaly present.  Cardiovascular: Normal rate, regular rhythm, normal heart sounds and intact distal pulses.   No murmur heard. Pulmonary/Chest: Effort normal. No respiratory distress. She has no wheezes. She has no rales.  Bilateral crackles   Musculoskeletal: She exhibits no edema.  FROM at bilateral knees without crepitus, no knee pain to palpation today. No popliteal fullness. Neg mcmurray's  Lymphadenopathy:    She has no cervical adenopathy.  Skin: Skin is warm and dry. No rash noted.  Psychiatric: She has a normal mood and affect.  Nursing note and vitals reviewed.     Assessment & Plan:   Problem List Items Addressed This Visit    Essential hypertension    BP wonderful - uses HCTZ more for leg swelling, advised to use PRN.      Senile dementia - Primary    Progression noted. Discussed higher level of care, 24 hour supervision and she may do better.  She has about 8-12 hours of caregiver supervision  currently. Aricept ineffective. DIL will discuss with family.      Cough    Persistent. Suggested delsym. Prior workup with CXR with COPD and CM, echo overall stable (increased PA pressures). ?mild pulm fibrosis.       Chest discomfort    Unable to get further history 2/2 dementia. Update EKG today for any changes from last one 06/2011. DIL will continue to monitor this. asxs currently. EKG - NSR rate 75, normal axis, prolonged PR, no acute ST/T changes, no  change from prior EKG 2013.      Relevant Orders   EKG 12-Lead (Completed)   Right knee pain    Anticipate OA related - continue tylenol.           Follow up plan: Return in about 6 months (around 04/26/2015), or as needed, for medicare wellness.

## 2014-10-24 NOTE — Progress Notes (Signed)
Pre visit review using our clinic review tool, if applicable. No additional management support is needed unless otherwise documented below in the visit note. 

## 2015-02-16 ENCOUNTER — Emergency Department
Admission: EM | Admit: 2015-02-16 | Discharge: 2015-02-16 | Disposition: A | Discharge status: Home / Self Care | Payer: Medicare Other | Attending: Emergency Medicine | Admitting: Emergency Medicine

## 2015-02-16 ENCOUNTER — Encounter: Payer: Self-pay | Admitting: *Deleted

## 2015-02-16 ENCOUNTER — Emergency Department: Payer: Medicare Other

## 2015-02-16 DIAGNOSIS — Y998 Other external cause status: Secondary | ICD-10-CM | POA: Insufficient documentation

## 2015-02-16 DIAGNOSIS — W19XXXA Unspecified fall, initial encounter: Secondary | ICD-10-CM

## 2015-02-16 DIAGNOSIS — Y9301 Activity, walking, marching and hiking: Secondary | ICD-10-CM | POA: Insufficient documentation

## 2015-02-16 DIAGNOSIS — W01198A Fall on same level from slipping, tripping and stumbling with subsequent striking against other object, initial encounter: Secondary | ICD-10-CM | POA: Diagnosis not present

## 2015-02-16 DIAGNOSIS — Z79899 Other long term (current) drug therapy: Secondary | ICD-10-CM | POA: Insufficient documentation

## 2015-02-16 DIAGNOSIS — S0181XA Laceration without foreign body of other part of head, initial encounter: Secondary | ICD-10-CM

## 2015-02-16 DIAGNOSIS — Y9289 Other specified places as the place of occurrence of the external cause: Secondary | ICD-10-CM | POA: Diagnosis not present

## 2015-02-16 DIAGNOSIS — S0990XA Unspecified injury of head, initial encounter: Secondary | ICD-10-CM | POA: Diagnosis present

## 2015-02-16 DIAGNOSIS — S022XXA Fracture of nasal bones, initial encounter for closed fracture: Secondary | ICD-10-CM | POA: Diagnosis not present

## 2015-02-16 DIAGNOSIS — I10 Essential (primary) hypertension: Secondary | ICD-10-CM | POA: Insufficient documentation

## 2015-02-16 NOTE — ED Provider Notes (Signed)
[facility] Emergency Department Provider Note   ____________________________________________  Time seen: 4:45 PM I have reviewed the triage vital signs and the triage nursing note.  HISTORY  Chief Complaint Fall and Head Injury   Historian Patient is limited historian due to dementia History is per the daughter-in-law  HPI Rhonda Foley is a 81 y.o. female who lives alone and is here for evaluation after trip and fall and has a facial laceration and hematoma/bruising over her nasal bridge. She was with her daughter-in-law when she stubbed her toe walking up a ramp. She's not been recently ill or had any medical complaints. She did strike her head and had nosebleed which is now hemostatic. She was able to walk after the fall. She is complaining of no pain right now. She does have a facial hematoma and starting of a black eye on the left. Her moderate. No alleviating or exacerbating symptoms.    Past Medical History  Diagnosis Date  . H/O: GI bleed 1993; 12/99  . HLD (hyperlipidemia) 1991  . HTN (hypertension) 1997  . History of UTI   . Dementia 06/2011    MMSE 14/30, thought senile although fmhx alz type; did not respond to aricept trial  . Cardiomegaly 04/2013    by CXR  . Thoracic compression fracture ([place]) 04/2013    by CXR  . COPD (chronic obstructive pulmonary disease) ([place]) 04/2013    by CXR  . Osteopenia 04/2013    by CXR    Patient Active Problem List   Diagnosis Date Noted  . Chest discomfort 10/24/2014  . Right knee pain 10/24/2014  . Dyspnea 04/05/2014  . Cardiomegaly 04/05/2014  . Cough 04/09/2013  . Bilateral venous insufficiency 09/01/2011  . Medicare annual wellness visit, initial 03/17/2011  . Senile dementia 03/17/2011  . HYPERLIPIDEMIA 10/19/2006  . Essential hypertension 10/19/2006  . MENOPAUSAL SYNDROME 10/19/2006  . GASTROINTESTINAL HEMORRHAGE, HX OF 10/19/2006    Past Surgical History  Procedure Laterality Date  .  Ankle fracture surgery  1993  . Sigmoidoscopy  06/21/96  . Shoulder surgery  12/09    Right; fracture--Dr. Prescott Parma  . US echocardiography  04/2014    ER 55-60%, (mod MR, mod TR, mod elevated PA pressures    Current Outpatient Rx  Name  Route  Sig  Dispense  Refill  . acetaminophen (TYLENOL) 500 MG tablet   Oral   Take 500 mg by mouth 3 (three) times daily as needed.         . Brompheniramine-Pseudoeph (DIMETAPP PO)   Oral   Take 5 mLs by mouth daily as needed.         . cetirizine (ZYRTEC) 10 MG tablet   Oral   Take 10 mg by mouth daily.         . hydrochlorothiazide (MICROZIDE) 12.5 MG capsule      TAKE ONE CAPSULE BY MOUTH DAILY   90 capsule   1   . loratadine (CLARITIN) 10 MG tablet   Oral   Take 10 mg by mouth daily.         . Multiple Vitamins-Minerals (MULTIVITAMIN PO)   Oral   Take 1 tablet by mouth daily.           Allergies Nsaids  Family History  Problem Relation Age of Onset  . Other Father     stomach problem; terminal stomach pain  . Other Father     "lost his mind"  . Alzheimer's disease Mother   .  Cancer Brother   . Cancer Sister   . Dementia Sister   . Hypertension Brother   . Hypertension Sister     Social History Social History  Substance Use Topics  . Smoking status: Never Smoker   . Smokeless tobacco: Never Used  . Alcohol Use: No    Review of Systems  Constitutional: Negative for fevers or recent illness. Eyes: Negative for visual changes. ENT: Negative for sore throat. Cardiovascular: Negative for chest pain. Respiratory: Negative for shortness of breath. Gastrointestinal: Negative for abdominal pain, vomiting and diarrhea. Genitourinary: Negative for dysuria. Musculoskeletal: Negative for back pain. Skin: Negative for rash. Neurological: Negative for headache. 10 point Review of Systems otherwise negative ____________________________________________   PHYSICAL EXAM:  VITAL SIGNS: ED Triage Vitals  Enc  Vitals Group     BP 02/16/15 1448 152/77 mmHg     Pulse Rate 02/16/15 1448 93     Resp 02/16/15 1448 20     Temp 02/16/15 1448 97.7 F (36.5 C)     Temp Source 02/16/15 1448 Oral     SpO2 02/16/15 1448 96 %     Weight 02/16/15 1448 134 lb (60.782 kg)     Height 02/16/15 1448 4\' 10"  (1.473 m)     Head Cir --      Peak Flow --      Pain Score --      Pain Loc --      Pain Edu? --      Excl. in [place]? --      Constitutional: Alert and cooperative. Well appearing and in no distress. Eyes: Conjunctivae are normal. PERRL. Normal extraocular movements. Developing ecchymosis under the left eye. ENT   Head: Normocephalic.  Swelling across the nasal bridge, no nasal septal hematoma. Laceration medial aspect above the left eyebrow, 3cm.   Nose: No congestion/rhinnorhea.   Mouth/Throat: Mucous membranes are moist. No dental injury.   Neck: No stridor. Cardiovascular/Chest: Normal rate, regular rhythm.  No murmurs, rubs, or gallops. Respiratory: Normal respiratory effort without tachypnea nor retractions. Breath sounds are clear and equal bilaterally. No wheezes/rales/rhonchi. Gastrointestinal: Soft. No distention, no guarding, no rebound. Nontender   Genitourinary/rectal:Deferred Musculoskeletal: Pelvis stable and hips nontender range of motion. Nontender with normal range of motion in all extremities. No joint effusions.  No lower extremity tenderness.  No edema. Neurologic:  No gross or focal neurologic deficits are appreciated. Skin:  Skin is warm, dry and intact. No rash noted.  ____________________________________________   EKG I, Lisa Roca, MD, the attending physician have personally viewed and interpreted all ECGs.  None ____________________________________________  LABS (pertinent positives/negatives)  None  ____________________________________________  RADIOLOGY All Xrays were viewed by me. Imaging interpreted by Radiologist.  CT noncontrast head, C-spine  and maxillofacial:  IMPRESSION: Atrophy with small vessel chronic ischemic changes of deep cerebral white matter.  No acute intracranial abnormalities.  Questionable 10 mm calcified meningioma at RIGHT lateral aspect of falx versus dural calcification.  Tiny distal RIGHT nasal bone fracture.  Question polyp within RIGHT nasal passages 17 x 12 mm.  Degenerative disc and facet disease changes of the cervical spine with suspected old S minimal superior endplate compression deformity of T1 vertebral body.  No definite acute cervical spine abnormalities. __________________________________________  PROCEDURES  Procedure(s) performed: Laceration repair of the left forehead laceration. 3 cm. Irrigated with saline to base in a bloodless field. No foreign body identified. Through skin and into muscle. Closed with skin glue  Critical Care performed: None  ____________________________________________  ED COURSE / ASSESSMENT AND PLAN  CONSULTATIONS: None  Pertinent labs & imaging results that were available during my care of the patient were reviewed by me and considered in my medical decision making (see chart for details).   This is a mechanical fall, no indication of fall being due to a new metabolic/medical illness. Traumatic evaluation included CTs of head neck and face. She does have a nasal fracture I discussed conservative management with the daughter-in-law. She has no nasal septal hematoma. She has no neck pain on exam, and her CT of the C-spine shows no traumatic injury.  Reportedly she is up-to-date on tetanus. Her laceration was repaired. I've asked her to follow up with her primary care physician.  Patient / Family / Caregiver informed of clinical course, medical decision-making process, and agree with plan.   I discussed return precautions, follow-up instructions, and discharged instructions with patient and/or  family.  ___________________________________________   FINAL CLINICAL IMPRESSION(S) / ED DIAGNOSES   Final diagnoses:  Nasal fracture, closed, initial encounter  Laceration of face, initial encounter       Lisa Roca, MD 02/16/15 [phone]

## 2015-02-16 NOTE — ED Notes (Signed)
Patient presents to the ED after tripping and falling, patient fell forward, tripping on a floor mat and hit her nose and forehead.  Patient has a laceration to her forehead and her nose is slightly bleeding.  Patient is in no obvious distress at this time.  Patient did not lose consciousness or vomit after her fall.

## 2015-02-16 NOTE — Discharge Instructions (Signed)
You were evaluated after a fall, and have a nasal bone fracture, and a facial laceration.  Return to the emergency room for any worsening condition including new confusion altered mental status, new pain or trouble walking, or any trouble breathing.   Facial Laceration A facial laceration is a cut on the face. These injuries can be painful and cause bleeding. Some cuts may need to be closed with stitches (sutures), skin adhesive strips, or wound glue. Cuts usually heal quickly but can leave a scar. It can take 1-2 years for the scar to go away completely. HOME CARE   Only take medicines as told by your doctor.  Follow your doctor's instructions for wound care. For Stitches:  Keep the cut clean and dry.  If you have a bandage (dressing), change it at least once a day. Change the bandage if it gets wet or dirty, or as told by your doctor.  Wash the cut with soap and water 2 times a day. Rinse the cut with water. Pat it dry with a clean towel.  Put a thin layer of medicated cream on the cut as told by your doctor.  You may shower after the first 24 hours. Do not soak the cut in water until the stitches are removed.  Have your stitches removed as told by your doctor.  Do not wear any makeup until a few days after your stitches are removed. For Skin Adhesive Strips:  Keep the cut clean and dry.  Do not get the strips wet. You may take a bath, but be careful to keep the cut dry.  If the cut gets wet, pat it dry with a clean towel.  The strips will fall off on their own. Do not remove the strips that are still stuck to the cut. For Wound Glue:  You may shower or take baths. Do not soak or scrub the cut. Do not swim. Avoid heavy sweating until the glue falls off on its own. After a shower or bath, pat the cut dry with a clean towel.  Do not put medicine or makeup on your cut until the glue falls off.  If you have a bandage, do not put tape over the glue.  Avoid lots of sunlight or  tanning lamps until the glue falls off.  The glue will fall off on its own in 5-10 days. Do not pick at the glue. After Healing:  Put sunscreen on the cut for the first year to reduce your scar. GET HELP IF:  You have a fever. GET HELP RIGHT AWAY IF:   Your cut area gets red, painful, or puffy (swollen).  You see a yellowish-white fluid (pus) coming from the cut.   This information is not intended to replace advice given to you by your health care provider. Make sure you discuss any questions you have with your health care provider.   Document Released: 08/04/2007 Document Revised: 03/08/2014 Document Reviewed: 09/28/2012 Elsevier Interactive Patient Education 2016 [employer]  Nasal Fracture A nasal fracture is a break or crack in the bones or cartilage of the nose. Minor breaks do not require treatment. These breaks usually heal on their own after about one month. Serious breaks may require surgery. CAUSES This injury is usually caused by a blunt injury to the nose. This type of injury often occurs from:  Contact sports.  Car accidents.  Falls.  Getting punched. SYMPTOMS Symptoms of this injury include:  Pain.  Swelling of the nose.  Bleeding from  the nose.  Bruising around the nose or eyes. This may include having black eyes.  Crooked appearance of the nose. DIAGNOSIS This injury may be diagnosed with a physical exam. The health care provider will gently feel the nose for signs of broken bones. He or she will look inside the nostrils to make sure that there is not a blood-filled swelling on the dividing wall between the nostrils (septal hematoma). X-rays of the nose may not show a nasal fracture even when one is present. In some cases, X-rays or a CT scan may be done 1-5 days after the injury. Sometimes, the health care provider will want to wait until the swelling has gone down. TREATMENT Often, minor fractures that have caused no deformity do not require  treatment. More serious fractures in which bones have moved out of position may require surgery, which will take place after the swelling is gone. Surgery will stabilize and align the fracture. In some cases, a health care provider may be able to reposition the bones without surgery. This may be done in the health care provider's office after medicine is given to numb the area (local anesthetic). HOME CARE INSTRUCTIONS  If directed, apply ice to the injured area:  Put ice in a plastic bag.  Place a towel between your skin and the bag.  Leave the ice on for 20 minutes, 2-3 times per day.  Take over-the-counter and prescription medicines only as told by your health care provider.  If your nose starts to bleed, sit in an upright position while you squeeze the soft parts of your nose against the dividing wall between your nostrils (septum) for 10 minutes.  Try to avoid blowing your nose.  Return to your normal activities as told by your health care provider. Ask your health care provider what activities are safe for you.  Avoid contact sports for 3-4 weeks or as told by your health care provider.  Keep all follow-up visits as told by your health care provider. This is important. SEEK MEDICAL CARE IF:  Your pain increases or becomes severe.  You continue to have nosebleeds.  The shape of your nose does not return to normal within 5 days.  You have pus draining out of your nose. SEEK IMMEDIATE MEDICAL CARE IF:  You have bleeding from your nose that does not stop after you pinch your nostrils closed for 20 minutes and keep ice on your nose.  You have clear fluid draining out of your nose.  You notice a grape-like swelling on the septum. This swelling is a collection of blood (hematoma) that must be drained to help prevent infection.  You have difficulty moving your eyes.  You have repeated vomiting.   This information is not intended to replace advice given to you by your health  care provider. Make sure you discuss any questions you have with your health care provider.   Document Released: 02/13/2000 Document Revised: 11/06/2014 Document Reviewed: 03/25/2014 Elsevier Interactive Patient Education [employer].

## 2015-02-20 ENCOUNTER — Ambulatory Visit (INDEPENDENT_AMBULATORY_CARE_PROVIDER_SITE_OTHER): Payer: Medicare Other | Admitting: Family Medicine

## 2015-02-20 ENCOUNTER — Encounter: Payer: Self-pay | Admitting: Family Medicine

## 2015-02-20 VITALS — BP 102/58 | HR 94 | Temp 98.0°F | Wt 140.5 lb

## 2015-02-20 DIAGNOSIS — R05 Cough: Secondary | ICD-10-CM

## 2015-02-20 DIAGNOSIS — R059 Cough, unspecified: Secondary | ICD-10-CM

## 2015-02-20 NOTE — Progress Notes (Signed)
Pre visit review using our clinic review tool, if applicable. No additional management support is needed unless otherwise documented below in the visit note. 

## 2015-02-20 NOTE — Assessment & Plan Note (Signed)
Acutely worsened but chronic issue. Exam reassuring. ?foul odor due to swallowed blood from nasal fracture. Reassurance provided- continue antihistamine with prn delsym. Call or return to clinic prn if these symptoms worsen or fail to improve as anticipated. The patient indicates understanding of these issues and agrees with the plan.

## 2015-02-20 NOTE — Progress Notes (Signed)
BP 102/58 mmHg  Pulse 94  Temp(Src) 98 F (36.7 C) (Oral)  Wt 140 lb 8 oz (63.73 kg)  SpO2 92%   CC: cough  Subjective:    Patient ID: Rhonda Foley, female    DOB: 02-24-1917, 81 y.o.   MRN: [id number]  HPI: Rhonda Foley is a 81 y.o. female pt of Dr. Darnell Level, new to me, presenting on 02/20/2015 with her daughter in lw for Cough and congestion in chest    Story complicated by dementia and recent fall - see ER note from 12/18 (4 days ago), which was reviewed today.  Tripped and fell at home- hematoma over nasal bridge. Ct confirmed nasal fracture- advised conservative management and laceration repaired with dermabond.   She does have ongoing episodes of coughing - alternates between claritin, allegra and zyrtec. Cough sounds productive.  Worse in the morning.  She does have h/o pulmonary fibrosis This morning, productive cough smelled bad so they wanted her to get checked out.  No fevers, chills or increased WOB.   denies GERD or abd discomfort.  Denies any dizziness.  Did have a fall in the house last week.  Ct Head Wo Contrast  02/16/2015  CLINICAL DATA:  Tripped on a rug and fell face first onto concrete floor, off LEFT frontal laceration, swelling and bruising to nose, dementia, hypertension, initial encounter EXAM: CT HEAD WITHOUT CONTRAST CT MAXILLOFACIAL WITHOUT CONTRAST CT CERVICAL SPINE WITHOUT CONTRAST TECHNIQUE: Multidetector CT imaging of the head, cervical spine, and maxillofacial structures were performed using the standard protocol without intravenous contrast. Multiplanar CT image reconstructions of the cervical spine and maxillofacial structures were also generated. Right side of face marked with BB. COMPARISON:  CT head and maxillofacial exams of 04/29/2014 FINDINGS: CT HEAD FINDINGS Generalized atrophy. Normal ventricular morphology. No midline shift or mass effect. Small vessel chronic ischemic changes of deep cerebral white matter. No intracranial hemorrhage  or evidence acute infarction. Calcified nodule adjacent to the RIGHT lateral aspect of the posterior falx 10 mm diameter question small calcified meningioma. No other intracranial mass lesions. Bones demineralized without fracture. LEFT frontal scalp soft tissue swelling with few foci of gas from known laceration. CT MAXILLOFACIAL FINDINGS Visualized intracranial structures as noted above. Small LEFT supraorbital scalp hematoma with tiny foci of gas consistent with laceration. Minimally displaced RIGHT nasal bone fracture. BILATERAL nasal soft tissue swelling. Bones demineralized. Question polyp within RIGHT nasal passage 17 x 12 mm image 40. Patient edentulous. Mucosal thickening and small echoes retention cyst in RIGHT maxillary sinus. Remaining paranasal sinuses, mastoid air cells and middle ear cavities clear bilaterally. No additional facial bone fractures identified. CT CERVICAL SPINE FINDINGS Diffuse osseous demineralization. Disc space narrowing with bulky endplate spur formation anteriorly at C6-C7. Mild disc space narrowing and endplate spur formation at C4-C5 and C5-C6. Minimal anterior height loss of T1 vertebral body, without acute fracture plane, likely old. Remaining vertebral body heights maintained. Visualized skullbase intact. No additional fracture, subluxation or bone destruction. Multilevel facet degenerative changes of the cervical spine. IMPRESSION: Atrophy with small vessel chronic ischemic changes of deep cerebral white matter. No acute intracranial abnormalities. Questionable 10 mm calcified meningioma at RIGHT lateral aspect of falx versus dural calcification. Tiny distal RIGHT nasal bone fracture. Question polyp within RIGHT nasal passages 17 x 12 mm. Degenerative disc and facet disease changes of the cervical spine with suspected old S minimal superior endplate compression deformity of T1 vertebral body. No definite acute cervical spine abnormalities. Electronically Signed  By: Lavonia Dana M.D.   On: 02/16/2015 16:27   Ct Cervical Spine Wo Contrast  02/16/2015  CLINICAL DATA:  Tripped on a rug and fell face first onto concrete floor, off LEFT frontal laceration, swelling and bruising to nose, dementia, hypertension, initial encounter EXAM: CT HEAD WITHOUT CONTRAST CT MAXILLOFACIAL WITHOUT CONTRAST CT CERVICAL SPINE WITHOUT CONTRAST TECHNIQUE: Multidetector CT imaging of the head, cervical spine, and maxillofacial structures were performed using the standard protocol without intravenous contrast. Multiplanar CT image reconstructions of the cervical spine and maxillofacial structures were also generated. Right side of face marked with BB. COMPARISON:  CT head and maxillofacial exams of 04/29/2014 FINDINGS: CT HEAD FINDINGS Generalized atrophy. Normal ventricular morphology. No midline shift or mass effect. Small vessel chronic ischemic changes of deep cerebral white matter. No intracranial hemorrhage or evidence acute infarction. Calcified nodule adjacent to the RIGHT lateral aspect of the posterior falx 10 mm diameter question small calcified meningioma. No other intracranial mass lesions. Bones demineralized without fracture. LEFT frontal scalp soft tissue swelling with few foci of gas from known laceration. CT MAXILLOFACIAL FINDINGS Visualized intracranial structures as noted above. Small LEFT supraorbital scalp hematoma with tiny foci of gas consistent with laceration. Minimally displaced RIGHT nasal bone fracture. BILATERAL nasal soft tissue swelling. Bones demineralized. Question polyp within RIGHT nasal passage 17 x 12 mm image 40. Patient edentulous. Mucosal thickening and small echoes retention cyst in RIGHT maxillary sinus. Remaining paranasal sinuses, mastoid air cells and middle ear cavities clear bilaterally. No additional facial bone fractures identified. CT CERVICAL SPINE FINDINGS Diffuse osseous demineralization. Disc space narrowing with bulky endplate spur formation  anteriorly at C6-C7. Mild disc space narrowing and endplate spur formation at C4-C5 and C5-C6. Minimal anterior height loss of T1 vertebral body, without acute fracture plane, likely old. Remaining vertebral body heights maintained. Visualized skullbase intact. No additional fracture, subluxation or bone destruction. Multilevel facet degenerative changes of the cervical spine. IMPRESSION: Atrophy with small vessel chronic ischemic changes of deep cerebral white matter. No acute intracranial abnormalities. Questionable 10 mm calcified meningioma at RIGHT lateral aspect of falx versus dural calcification. Tiny distal RIGHT nasal bone fracture. Question polyp within RIGHT nasal passages 17 x 12 mm. Degenerative disc and facet disease changes of the cervical spine with suspected old S minimal superior endplate compression deformity of T1 vertebral body. No definite acute cervical spine abnormalities. Electronically Signed   By: Lavonia Dana M.D.   On: 02/16/2015 16:27   Ct Maxillofacial Wo Cm  02/16/2015  CLINICAL DATA:  Tripped on a rug and fell face first onto concrete floor, off LEFT frontal laceration, swelling and bruising to nose, dementia, hypertension, initial encounter EXAM: CT HEAD WITHOUT CONTRAST CT MAXILLOFACIAL WITHOUT CONTRAST CT CERVICAL SPINE WITHOUT CONTRAST TECHNIQUE: Multidetector CT imaging of the head, cervical spine, and maxillofacial structures were performed using the standard protocol without intravenous contrast. Multiplanar CT image reconstructions of the cervical spine and maxillofacial structures were also generated. Right side of face marked with BB. COMPARISON:  CT head and maxillofacial exams of 04/29/2014 FINDINGS: CT HEAD FINDINGS Generalized atrophy. Normal ventricular morphology. No midline shift or mass effect. Small vessel chronic ischemic changes of deep cerebral white matter. No intracranial hemorrhage or evidence acute infarction. Calcified nodule adjacent to the RIGHT  lateral aspect of the posterior falx 10 mm diameter question small calcified meningioma. No other intracranial mass lesions. Bones demineralized without fracture. LEFT frontal scalp soft tissue swelling with few foci of gas from known laceration. CT  MAXILLOFACIAL FINDINGS Visualized intracranial structures as noted above. Small LEFT supraorbital scalp hematoma with tiny foci of gas consistent with laceration. Minimally displaced RIGHT nasal bone fracture. BILATERAL nasal soft tissue swelling. Bones demineralized. Question polyp within RIGHT nasal passage 17 x 12 mm image 40. Patient edentulous. Mucosal thickening and small echoes retention cyst in RIGHT maxillary sinus. Remaining paranasal sinuses, mastoid air cells and middle ear cavities clear bilaterally. No additional facial bone fractures identified. CT CERVICAL SPINE FINDINGS Diffuse osseous demineralization. Disc space narrowing with bulky endplate spur formation anteriorly at C6-C7. Mild disc space narrowing and endplate spur formation at C4-C5 and C5-C6. Minimal anterior height loss of T1 vertebral body, without acute fracture plane, likely old. Remaining vertebral body heights maintained. Visualized skullbase intact. No additional fracture, subluxation or bone destruction. Multilevel facet degenerative changes of the cervical spine. IMPRESSION: Atrophy with small vessel chronic ischemic changes of deep cerebral white matter. No acute intracranial abnormalities. Questionable 10 mm calcified meningioma at RIGHT lateral aspect of falx versus dural calcification. Tiny distal RIGHT nasal bone fracture. Question polyp within RIGHT nasal passages 17 x 12 mm. Degenerative disc and facet disease changes of the cervical spine with suspected old S minimal superior endplate compression deformity of T1 vertebral body. No definite acute cervical spine abnormalities. Electronically Signed   By: Lavonia Dana M.D.   On: 02/16/2015 16:27     Wt Readings from Last 3  Encounters:  02/20/15 140 lb 8 oz (63.73 kg)  02/16/15 134 lb (60.782 kg)  10/24/14 135 lb (61.236 kg)    Relevant past medical, surgical, family and social history reviewed and updated as indicated.  Allergies and medications reviewed and updated. Current Outpatient Prescriptions on File Prior to Visit  Medication Sig  . acetaminophen (TYLENOL) 500 MG tablet Take 500 mg by mouth 3 (three) times daily as needed.  . Brompheniramine-Pseudoeph (DIMETAPP PO) Take 5 mLs by mouth daily as needed.  . cetirizine (ZYRTEC) 10 MG tablet Take 10 mg by mouth daily.  . hydrochlorothiazide (MICROZIDE) 12.5 MG capsule TAKE ONE CAPSULE BY MOUTH DAILY  . loratadine (CLARITIN) 10 MG tablet Take 10 mg by mouth daily.  . Multiple Vitamins-Minerals (MULTIVITAMIN PO) Take 1 tablet by mouth daily.   No current facility-administered medications on file prior to visit.    Review of Systems Per HPI unless specifically indicated above    Objective:    BP 102/58 mmHg  Pulse 94  Temp(Src) 98 F (36.7 C) (Oral)  Wt 140 lb 8 oz (63.73 kg)  SpO2 92%  Physical Exam  Constitutional: She appears well-developed and well-nourished. No distress.  HENT:  Mouth/Throat: Oropharynx is clear and moist. No oropharyngeal exudate.  Eyes: Conjunctivae and EOM are normal. Pupils are equal, round, and reactive to light.  Neck: No hepatojugular reflux and no JVD present.  Cardiovascular: Normal rate, regular rhythm, normal heart sounds and intact distal pulses.   No murmur heard. Pulmonary/Chest: Effort normal and breath sounds normal. No respiratory distress. She has no wheezes. She has no rales.  Crackles throughout bilateral lungs  Musculoskeletal: She exhibits no edema (nonpitting edema).  Skin:  Significant facial bruising, laceration above left eye c/d/i  Nursing note and vitals reviewed.      Assessment & Plan:   Problem List Items Addressed This Visit    None       Follow up plan: No Follow-up on  file.

## 2015-02-24 ENCOUNTER — Encounter (HOSPITAL_COMMUNITY): Payer: Self-pay | Admitting: Emergency Medicine

## 2015-02-24 ENCOUNTER — Inpatient Hospital Stay (HOSPITAL_COMMUNITY)
Admission: EM | Admit: 2015-02-24 | Discharge: 2015-02-28 | DRG: 190 | Disposition: A | Discharge status: Home / Self Care | Payer: Medicare Other | Attending: Internal Medicine | Admitting: Internal Medicine

## 2015-02-24 ENCOUNTER — Encounter (HOSPITAL_COMMUNITY): Payer: Self-pay

## 2015-02-24 ENCOUNTER — Emergency Department (HOSPITAL_COMMUNITY): Payer: Medicare Other

## 2015-02-24 ENCOUNTER — Telehealth: Payer: Self-pay | Admitting: Family Medicine

## 2015-02-24 ENCOUNTER — Emergency Department (HOSPITAL_COMMUNITY)
Admission: EM | Admit: 2015-02-24 | Discharge: 2015-02-24 | Disposition: A | Discharge status: Other Acute Inpatient Hospital | Payer: Medicare Other | Source: Home / Self Care | Attending: Emergency Medicine | Admitting: Emergency Medicine

## 2015-02-24 DIAGNOSIS — F0390 Unspecified dementia without behavioral disturbance: Secondary | ICD-10-CM | POA: Diagnosis present

## 2015-02-24 DIAGNOSIS — R0600 Dyspnea, unspecified: Secondary | ICD-10-CM | POA: Diagnosis not present

## 2015-02-24 DIAGNOSIS — D473 Essential (hemorrhagic) thrombocythemia: Secondary | ICD-10-CM | POA: Diagnosis present

## 2015-02-24 DIAGNOSIS — I5189 Other ill-defined heart diseases: Secondary | ICD-10-CM | POA: Diagnosis present

## 2015-02-24 DIAGNOSIS — F0391 Unspecified dementia with behavioral disturbance: Secondary | ICD-10-CM

## 2015-02-24 DIAGNOSIS — J9601 Acute respiratory failure with hypoxia: Secondary | ICD-10-CM | POA: Diagnosis not present

## 2015-02-24 DIAGNOSIS — M8580 Other specified disorders of bone density and structure, unspecified site: Secondary | ICD-10-CM | POA: Diagnosis present

## 2015-02-24 DIAGNOSIS — J440 Chronic obstructive pulmonary disease with acute lower respiratory infection: Secondary | ICD-10-CM | POA: Diagnosis present

## 2015-02-24 DIAGNOSIS — Z66 Do not resuscitate: Secondary | ICD-10-CM | POA: Diagnosis present

## 2015-02-24 DIAGNOSIS — R911 Solitary pulmonary nodule: Secondary | ICD-10-CM

## 2015-02-24 DIAGNOSIS — I10 Essential (primary) hypertension: Secondary | ICD-10-CM | POA: Diagnosis not present

## 2015-02-24 DIAGNOSIS — R05 Cough: Secondary | ICD-10-CM | POA: Diagnosis present

## 2015-02-24 DIAGNOSIS — I119 Hypertensive heart disease without heart failure: Secondary | ICD-10-CM | POA: Diagnosis present

## 2015-02-24 DIAGNOSIS — I517 Cardiomegaly: Secondary | ICD-10-CM | POA: Diagnosis present

## 2015-02-24 DIAGNOSIS — E876 Hypokalemia: Secondary | ICD-10-CM | POA: Diagnosis present

## 2015-02-24 DIAGNOSIS — R062 Wheezing: Secondary | ICD-10-CM

## 2015-02-24 DIAGNOSIS — J189 Pneumonia, unspecified organism: Secondary | ICD-10-CM | POA: Diagnosis present

## 2015-02-24 DIAGNOSIS — Z79899 Other long term (current) drug therapy: Secondary | ICD-10-CM

## 2015-02-24 DIAGNOSIS — R059 Cough, unspecified: Secondary | ICD-10-CM

## 2015-02-24 DIAGNOSIS — E785 Hyperlipidemia, unspecified: Secondary | ICD-10-CM | POA: Diagnosis present

## 2015-02-24 HISTORY — DX: Chronic or unspecified gastric ulcer with hemorrhage: K25.4

## 2015-02-24 HISTORY — DX: Unspecified osteoarthritis, unspecified site: M19.90

## 2015-02-24 HISTORY — DX: Fracture of right shoulder girdle, part unspecified, initial encounter for closed fracture: S42.91XA

## 2015-02-24 HISTORY — DX: Pneumonia, unspecified organism: J18.9

## 2015-02-24 HISTORY — DX: Other amnesia: R41.3

## 2015-02-24 LAB — I-STAT CG4 LACTIC ACID, ED: Lactic Acid, Venous: 1.11 mmol/L (ref 0.5–2.0)

## 2015-02-24 LAB — BASIC METABOLIC PANEL
ANION GAP: 13 (ref 5–15)
BUN: 14 mg/dL (ref 6–20)
CHLORIDE: 96 mmol/L — AB (ref 101–111)
CO2: 29 mmol/L (ref 22–32)
Calcium: 9.5 mg/dL (ref 8.9–10.3)
Creatinine, Ser: 0.77 mg/dL (ref 0.44–1.00)
GFR calc non Af Amer: >60 mL/min (ref >60)
Glucose, Bld: 148 mg/dL — ABNORMAL HIGH (ref 65–99)
Potassium: 3 mmol/L — ABNORMAL LOW (ref 3.5–5.1)
Sodium: 138 mmol/L (ref 135–145)

## 2015-02-24 LAB — CBC WITH DIFFERENTIAL/PLATELET
BASOS ABS: 0 10*3/uL (ref 0.0–0.1)
BASOS PCT: 0 %
Eosinophils Absolute: 0.2 10*3/uL (ref 0.0–0.7)
Eosinophils Relative: 2 %
HEMATOCRIT: 37.3 % (ref 36.0–46.0)
HEMOGLOBIN: 12.2 g/dL (ref 12.0–15.0)
LYMPHS PCT: 8 %
Lymphs Abs: 1.1 10*3/uL (ref 0.7–4.0)
MCH: 29.6 pg (ref 26.0–34.0)
MCHC: 32.7 g/dL (ref 30.0–36.0)
MCV: 90.5 fL (ref 78.0–100.0)
MONOS PCT: 19 %
Monocytes Absolute: 2.5 10*3/uL — ABNORMAL HIGH (ref 0.1–1.0)
NEUTROS ABS: 9.7 10*3/uL — AB (ref 1.7–7.7)
NEUTROS PCT: 71 %
Platelets: 404 10*3/uL — ABNORMAL HIGH (ref 150–400)
RBC: 4.12 MIL/uL (ref 3.87–5.11)
RDW: 13.3 % (ref 11.5–15.5)
WBC: 13.6 10*3/uL — ABNORMAL HIGH (ref 4.0–10.5)

## 2015-02-24 LAB — I-STAT ARTERIAL BLOOD GAS, ED
Acid-Base Excess: 6 mmol/L — ABNORMAL HIGH (ref 0.0–2.0)
Bicarbonate: 30.8 mEq/L — ABNORMAL HIGH (ref 20.0–24.0)
O2 Saturation: 96 %
PCO2 ART: 43.5 mmHg (ref 35.0–45.0)
PH ART: 7.458 — AB (ref 7.350–7.450)
PO2 ART: 79 mmHg — AB (ref 80.0–100.0)
Patient temperature: 98.6
TCO2: 32 mmol/L (ref 0–100)

## 2015-02-24 MED ORDER — DEXTROSE 5 % IV SOLN
1.0000 g | INTRAVENOUS | Status: DC
  Administered 2015-02-25 – 2015-02-27 (×3): 1 g via INTRAVENOUS
  Filled 2015-02-24 (×4): qty 10

## 2015-02-24 MED ORDER — LORATADINE 10 MG PO TABS
10.0000 mg | ORAL_TABLET | Freq: Every day | ORAL | Status: DC
  Administered 2015-02-25 – 2015-02-28 (×4): 10 mg via ORAL
  Filled 2015-02-24 (×4): qty 1

## 2015-02-24 MED ORDER — SODIUM CHLORIDE 0.9 % IJ SOLN: 3.0000 mL | INTRAMUSCULAR | Status: DC | PRN

## 2015-02-24 MED ORDER — POTASSIUM CHLORIDE CRYS ER 20 MEQ PO TBCR
40.0000 meq | EXTENDED_RELEASE_TABLET | ORAL | Status: AC
  Administered 2015-02-24 (×2): 40 meq via ORAL
  Filled 2015-02-24 (×2): qty 2

## 2015-02-24 MED ORDER — ALBUTEROL SULFATE (2.5 MG/3ML) 0.083% IN NEBU: 2.5000 mg | INHALATION_SOLUTION | Freq: Four times a day (QID) | RESPIRATORY_TRACT | Status: DC | PRN

## 2015-02-24 MED ORDER — CEFTRIAXONE SODIUM 1 G IJ SOLR: 1.0000 g | INTRAMUSCULAR | Status: DC

## 2015-02-24 MED ORDER — RISPERIDONE 1 MG/ML PO SOLN
0.5000 mg | Freq: Every evening | ORAL | Status: DC | PRN
  Administered 2015-02-24: 0.5 mg via ORAL
  Filled 2015-02-24 (×3): qty 0.5

## 2015-02-24 MED ORDER — AZITHROMYCIN 500 MG PO TABS
500.0000 mg | ORAL_TABLET | ORAL | Status: DC
  Administered 2015-02-25 – 2015-02-27 (×3): 500 mg via ORAL
  Filled 2015-02-24 (×3): qty 1

## 2015-02-24 MED ORDER — SODIUM CHLORIDE 0.9 % IV SOLN: 250.0000 mL | INTRAVENOUS | Status: DC | PRN

## 2015-02-24 MED ORDER — AZITHROMYCIN 500 MG IV SOLR
500.0000 mg | Freq: Once | INTRAVENOUS | Status: AC
  Administered 2015-02-24: 500 mg via INTRAVENOUS
  Filled 2015-02-24: qty 500

## 2015-02-24 MED ORDER — SODIUM CHLORIDE 0.9 % IV SOLN
Freq: Once | INTRAVENOUS | Status: AC
  Administered 2015-02-24: 16:00:00 via INTRAVENOUS

## 2015-02-24 MED ORDER — GUAIFENESIN ER 600 MG PO TB12
600.0000 mg | ORAL_TABLET | Freq: Two times a day (BID) | ORAL | Status: DC
  Administered 2015-02-24 – 2015-02-28 (×8): 600 mg via ORAL
  Filled 2015-02-24 (×8): qty 1

## 2015-02-24 MED ORDER — ENOXAPARIN SODIUM 40 MG/0.4ML ~~LOC~~ SOLN
40.0000 mg | SUBCUTANEOUS | Status: DC
  Administered 2015-02-24 – 2015-02-27 (×4): 40 mg via SUBCUTANEOUS
  Filled 2015-02-24 (×4): qty 0.4

## 2015-02-24 MED ORDER — AZITHROMYCIN 500 MG PO TABS: 500.0000 mg | ORAL_TABLET | ORAL | Status: DC

## 2015-02-24 MED ORDER — CETYLPYRIDINIUM CHLORIDE 0.05 % MT LIQD
7.0000 mL | Freq: Two times a day (BID) | OROMUCOSAL | Status: DC
  Administered 2015-02-25 – 2015-02-27 (×4): 7 mL via OROMUCOSAL

## 2015-02-24 MED ORDER — SODIUM CHLORIDE 0.9 % IJ SOLN
3.0000 mL | Freq: Two times a day (BID) | INTRAMUSCULAR | Status: DC
Start: 2015-02-24 — End: 2015-02-28
  Administered 2015-02-24 – 2015-02-27 (×7): 3 mL via INTRAVENOUS

## 2015-02-24 MED ORDER — ALBUTEROL SULFATE (2.5 MG/3ML) 0.083% IN NEBU
2.5000 mg | INHALATION_SOLUTION | Freq: Once | RESPIRATORY_TRACT | Status: AC
  Administered 2015-02-24: 2.5 mg via RESPIRATORY_TRACT
  Filled 2015-02-24: qty 3

## 2015-02-24 MED ORDER — DEXTROSE 5 % IV SOLN
1.0000 g | Freq: Once | INTRAVENOUS | Status: AC
  Administered 2015-02-24: 1 g via INTRAVENOUS
  Filled 2015-02-24: qty 10

## 2015-02-24 MED ORDER — HYDROCHLOROTHIAZIDE 12.5 MG PO CAPS
12.5000 mg | ORAL_CAPSULE | Freq: Every day | ORAL | Status: DC
  Administered 2015-02-25 – 2015-02-28 (×4): 12.5 mg via ORAL
  Filled 2015-02-24 (×5): qty 1

## 2015-02-24 MED ORDER — CHLORHEXIDINE GLUCONATE 0.12 % MT SOLN
15.0000 mL | Freq: Two times a day (BID) | OROMUCOSAL | Status: DC
  Administered 2015-02-24 – 2015-02-28 (×7): 15 mL via OROMUCOSAL
  Filled 2015-02-24 (×7): qty 15

## 2015-02-24 NOTE — ED Notes (Signed)
Pts vital signs updated. Pt remains on pulse ox. Blood pressure and 5 lead removed to agitation to pt. Pts family remains at bedside.

## 2015-02-24 NOTE — ED Notes (Signed)
PT to xray at this time.

## 2015-02-24 NOTE — H&P (Signed)
[facility] History and Physical  DENNETTE HEDEEN [id number] DOB: [birth year] DOA: 02/24/2015   PCP: [name], MD    Chief Complaint: cough  HPI: Rhonda Foley is a 81 y.o. female with dementia and HTN who is brought in by her son and daughter in law for a cough that has been going on for 5-6 days now. Patient is confused and history is obtained from daughter in law.  She was seen by primary care on 12/22 and was recommended to take symptomatic treatment but the cough progressed. She is found to by hypoxic with a pulse ox of about 88-89% on room air. CXR sugesitve of possible RLL infiltrate.  No c/o chest pain or fevers.  She also tripped and fell a few days ago resulting in severe bruising of her face. She does not remember this.   ROS: cannot obtain due to dementia.    Past Medical History  Diagnosis Date  . H/O: GI bleed 1993; 12/99  . HLD (hyperlipidemia) 1991  . HTN (hypertension) 1997  . History of UTI   . Dementia 06/2011    MMSE 14/30, thought senile although fmhx alz type; did not respond to aricept trial  . Cardiomegaly 04/2013    by CXR  . Thoracic compression fracture ([place]) 04/2013    by CXR  . COPD (chronic obstructive pulmonary disease) ([place]) 04/2013    by CXR  . Osteopenia 04/2013    by CXR    Past Surgical History  Procedure Laterality Date  . Ankle fracture surgery  1993  . Sigmoidoscopy  06/21/96  . Shoulder surgery  12/09    Right; fracture--Dr. Prescott Parma  . US echocardiography  04/2014    ER 55-60%, (mod MR, mod TR, mod elevated PA pressures    Social History: never smoked or drank alcohol Lives at home alone- has caretakers during the day but alone at night.    Allergies  Allergen Reactions  . Nsaids Nausea And Vomiting    Family history:   Family History  Problem Relation Age of Onset  . Other Father     stomach problem; terminal stomach pain  . Other Father     "lost his mind"  . Alzheimer's disease Mother   . Cancer Brother    . Cancer Sister   . Dementia Sister   . Hypertension Brother   . Hypertension Sister       Prior to Admission medications   Medication Sig Start Date End Date Taking? Authorizing Provider  acetaminophen (TYLENOL) 500 MG tablet Take 500 mg by mouth 3 (three) times daily as needed.    Historical Provider, MD  Brompheniramine-Pseudoeph (DIMETAPP PO) Take 5 mLs by mouth daily as needed.    Historical Provider, MD  cetirizine (ZYRTEC) 10 MG tablet Take 10 mg by mouth daily.    Historical Provider, MD  hydrochlorothiazide (MICROZIDE) 12.5 MG capsule TAKE ONE CAPSULE BY MOUTH DAILY 10/21/14   [name], MD  loratadine (CLARITIN) 10 MG tablet Take 10 mg by mouth daily.    Historical Provider, MD  Multiple Vitamins-Minerals (MULTIVITAMIN PO) Take 1 tablet by mouth daily.    Historical Provider, MD     Physical Exam: Filed Vitals:   02/24/15 1645 02/24/15 1742 02/24/15 1743  BP: 124/75  147/73  Pulse: 89  101  Resp: 18  20  SpO2: 95% 93% 90%     General: AAO x3, no distress, pleasantly confused HEENT: Normocephalic - significant bruising around left eye, cheek bone and  nose                 Mucous membranes pink                PERRLA; EOM intact; No scleral icterus,                 Nares: Patent, Oropharynx: Clear, Fair Dentition                 Neck: FROM, no cervical lymphadenopathy, thyromegaly, carotid bruit or JVD;  Breasts: deferred CHEST WALL: No tenderness  CHEST: Normal respiration, clear to auscultation bilaterally  HEART: Regular rate and rhythm; no murmurs rubs or gallops  BACK: No kyphosis or scoliosis; no CVA tenderness  GI: Positive Bowel Sounds, soft, non-tender; no masses, no organomegaly Rectal Exam: deferred MSK: No cyanosis, clubbing, or edema Genitalia: not examined  SKIN:  no rash or ulceration  CNS: Alert- Nonfocal exam, CN 2-12 intact  Labs on Admission:  Basic Metabolic Panel:  Recent Labs Lab 02/24/15 1745  NA 138  K 3.0*  CL 96*  CO2 29   GLUCOSE 148*  BUN 14  CREATININE 0.77  CALCIUM 9.5   Liver Function Tests: No results for input(s): AST, ALT, ALKPHOS, BILITOT, PROT, ALBUMIN in the last 168 hours. No results for input(s): LIPASE, AMYLASE in the last 168 hours. No results for input(s): AMMONIA in the last 168 hours. CBC:  Recent Labs Lab 02/24/15 1745  WBC 13.6*  NEUTROABS 9.7*  HGB 12.2  HCT 37.3  MCV 90.5  PLT 404*   Cardiac Enzymes: No results for input(s): CKTOTAL, CKMB, CKMBINDEX, TROPONINI in the last 168 hours.  BNP (last 3 results) No results for input(s): BNP in the last 8760 hours.  ProBNP (last 3 results) No results for input(s): PROBNP in the last 8760 hours.  CBG: No results for input(s): GLUCAP in the last 168 hours.  Radiological Exams on Admission: Dg Chest 2 View  02/24/2015  CLINICAL DATA:  Cough and short of breath EXAM: CHEST  2 VIEW COMPARISON:  04/09/2013 FINDINGS: Cardiac enlargement without heart failure or edema. 1 cm nodule overlying the right lung base, probably in the right middle lobe. This was not present previously. This could be a lung nodule or infiltrate. Thickening of the minor fissure possibly with mild infiltrate, this has developed since the prior study. Small right pleural effusion.  Left lung is clear IMPRESSION: Possible 1 cm nodule right middle lobe.  Pneumonia is also possible Thickening of the minor fissure with possible right upper lobe infiltrate adjacent to the minor fissure. CT chest without contrast suggested for  further evaluation. Electronically Signed   By: Franchot Gallo M.D.   On: 02/24/2015 17:52    EKG: Independently reviewed. Sinus tachycardia  Assessment/Plan Principal Problem:   CAP (community acquired pneumonia)/ leukocytosis- acute resp failure with hypoxia - possible RLL infiltrate(vs nodule) with cough - would repeat CXR in 2 wks  - started on Rocephin and Zithromax- strep and legionella antigens ordered - cont O2 - wean as  possible  Active Problems: Thrombocytosis - due to inflammation? - follow  Hypokalemia - replace and recheck tomorrow    Essential hypertension - cont HCTZ    Senile dementia - family will stay with her overnight but would like her to have something in case she gets agitated- will order a low dose of Risperdal    Cardiomegaly - seen on CXR- no pulm edema - will not order ECHO    Consulted:  Code Status: Full code  Family Communication: daughter in law and son  DVT Prophylaxis:lovenox  Time spent: 100 min  Shady Dale, MD [facility]  If 7PM-7AM, please contact night-coverage [email or web address] 02/24/2015, 6:54 PM

## 2015-02-24 NOTE — ED Notes (Signed)
No [facility] truck available/[facility] called

## 2015-02-24 NOTE — ED Notes (Signed)
Pt's daughter n law st's pt started having cold symptoms 5 days ago.  Has been giving pt Muncinex and Robitussin with no relief from cough.  Pt was sent here from Urgent Care

## 2015-02-24 NOTE — ED Notes (Signed)
Family concerned about cough, fever, congestion

## 2015-02-24 NOTE — ED Notes (Signed)
Pt placed on monitor upon arrival to room. Pt monitored by blood pressure, pulse ox, and 12 lead. pts family remains at bedside.

## 2015-02-24 NOTE — ED Provider Notes (Signed)
CSN: [id number]     Arrival date & time 02/24/15  1619 History   First MD Initiated Contact with Patient 02/24/15 1625     Chief Complaint  Patient presents with  . Shortness of Breath     (Consider location/radiation/quality/duration/timing/severity/associated sxs/prior Treatment) Patient is a 81 y.o. female presenting with cough. The history is provided by the patient and a relative.  Cough Cough characteristics:  Productive Sputum characteristics:  White Severity:  Moderate Onset quality:  Gradual Duration:  1 week Timing:  Constant Progression:  Worsening Chronicity:  New Smoker: no   Context: not sick contacts and not smoke exposure   Relieved by:  Nothing Worsened by:  Activity Ineffective treatments:  Rest Associated symptoms: chills, fever (subjective), rhinorrhea, shortness of breath and wheezing   Associated symptoms: no chest pain, no diaphoresis, no ear fullness, no ear pain, no eye discharge, no headaches, no myalgias, no rash, no sore throat and no weight loss   Risk factors: no recent infection and no recent travel     Past Medical History  Diagnosis Date  . H/O: GI bleed 1993; 12/99  . HLD (hyperlipidemia) 1991  . HTN (hypertension) 1997  . History of UTI   . Dementia 06/2011    MMSE 14/30, thought senile although fmhx alz type; did not respond to aricept trial  . Cardiomegaly 04/2013    by CXR  . Thoracic compression fracture ([place]) 04/2013    by CXR  . COPD (chronic obstructive pulmonary disease) ([place]) 04/2013    by CXR  . Osteopenia 04/2013    by CXR   Past Surgical History  Procedure Laterality Date  . Ankle fracture surgery  1993  . Sigmoidoscopy  06/21/96  . Shoulder surgery  12/09    Right; fracture--Dr. Prescott Parma  . US echocardiography  04/2014    ER 55-60%, (mod MR, mod TR, mod elevated PA pressures   Family History  Problem Relation Age of Onset  . Other Father     stomach problem; terminal stomach pain  . Other Father     "lost his mind"    . Alzheimer's disease Mother   . Cancer Brother   . Cancer Sister   . Dementia Sister   . Hypertension Brother   . Hypertension Sister    Social History  Substance Use Topics  . Smoking status: Never Smoker   . Smokeless tobacco: Never Used  . Alcohol Use: No   OB History    No data available     Review of Systems  Constitutional: Positive for fever (subjective) and chills. Negative for weight loss and diaphoresis.  HENT: Positive for rhinorrhea. Negative for ear pain and sore throat.   Eyes: Negative for pain and discharge.  Respiratory: Positive for cough, chest tightness, shortness of breath and wheezing.   Cardiovascular: Negative for chest pain.  Gastrointestinal: Negative for nausea, vomiting and abdominal pain.  Genitourinary: Negative for dysuria.  Musculoskeletal: Negative for myalgias.  Skin: Negative for rash.  Neurological: Negative for headaches.      Allergies  Nsaids  Home Medications   Prior to Admission medications   Medication Sig Start Date End Date Taking? Authorizing Provider  acetaminophen (TYLENOL) 500 MG tablet Take 500 mg by mouth 3 (three) times daily as needed.    Historical Provider, MD  Brompheniramine-Pseudoeph (DIMETAPP PO) Take 5 mLs by mouth daily as needed.    Historical Provider, MD  cetirizine (ZYRTEC) 10 MG tablet Take 10 mg by mouth daily.  Historical Provider, MD  hydrochlorothiazide (MICROZIDE) 12.5 MG capsule TAKE ONE CAPSULE BY MOUTH DAILY 10/21/14   [name], MD  loratadine (CLARITIN) 10 MG tablet Take 10 mg by mouth daily.    Historical Provider, MD  Multiple Vitamins-Minerals (MULTIVITAMIN PO) Take 1 tablet by mouth daily.    Historical Provider, MD   BP 147/73 mmHg  Pulse 101  Resp 20  SpO2 90% Physical Exam  Constitutional: Vital signs are normal. She appears well-developed and well-nourished. She does not appear ill. No distress.  HENT:  Head: Head is with contusion.    Mouth/Throat: Mucous membranes  are dry.  Eyes: Conjunctivae and EOM are normal. Pupils are equal, round, and reactive to light.  Neck: Normal range of motion.  Cardiovascular: Regular rhythm and normal pulses.   No murmur heard. Pulmonary/Chest: Effort normal. No tachypnea and no bradypnea. She has no decreased breath sounds. She has wheezes (bilateral end expiratory wheezing) in the right upper field, the right middle field, the right lower field, the left upper field, the left middle field and the left lower field. She has no rhonchi. She has no rales.  Abdominal: Normal appearance. She exhibits no distension and no fluid wave. There is no tenderness. There is no rigidity, no guarding, no CVA tenderness, no tenderness at McBurney's point and negative Murphy's sign.  Neurological: She is alert. She is disoriented (Pt oriented to self and place only). No cranial nerve deficit or sensory deficit. GCS eye subscore is 4. GCS verbal subscore is 5. GCS motor subscore is 6.  Skin: Bruising and ecchymosis noted. No rash noted.  Psychiatric: She has a normal mood and affect. Her speech is normal and behavior is normal. Cognition and memory are impaired.    ED Course  Procedures (including critical care time) Labs Review Labs Reviewed  CBC WITH DIFFERENTIAL/PLATELET - Abnormal; Notable for the following:    WBC 13.6 (*)    Platelets 404 (*)    Neutro Abs 9.7 (*)    Monocytes Absolute 2.5 (*)    All other components within normal limits  BASIC METABOLIC PANEL - Abnormal; Notable for the following:    Potassium 3.0 (*)    Chloride 96 (*)    Glucose, Bld 148 (*)    All other components within normal limits  I-STAT ARTERIAL BLOOD GAS, ED - Abnormal; Notable for the following:    pH, Arterial 7.458 (*)    pO2, Arterial 79.0 (*)    Bicarbonate 30.8 (*)    Acid-Base Excess 6.0 (*)    All other components within normal limits  BLOOD GAS, ARTERIAL  I-STAT CG4 LACTIC ACID, ED    Imaging Review Dg Chest 2 View  02/24/2015   CLINICAL DATA:  Cough and short of breath EXAM: CHEST  2 VIEW COMPARISON:  04/09/2013 FINDINGS: Cardiac enlargement without heart failure or edema. 1 cm nodule overlying the right lung base, probably in the right middle lobe. This was not present previously. This could be a lung nodule or infiltrate. Thickening of the minor fissure possibly with mild infiltrate, this has developed since the prior study. Small right pleural effusion.  Left lung is clear IMPRESSION: Possible 1 cm nodule right middle lobe.  Pneumonia is also possible Thickening of the minor fissure with possible right upper lobe infiltrate adjacent to the minor fissure. CT chest without contrast suggested for  further evaluation. Electronically Signed   By: Franchot Gallo M.D.   On: 02/24/2015 17:52   I have personally reviewed  and evaluated these images and lab results as part of my medical decision-making.   EKG Interpretation   Date/Time:  Monday February 24 2015 16:25:53 EST Ventricular Rate:  101 PR Interval:  192 QRS Duration: 79 QT Interval:  316 QTC Calculation: 409 R Axis:   13 Text Interpretation:  Sinus tachycardia Atrial premature complex Low  voltage, precordial leads Baseline wander in lead(s) III Left anterior  fasicular block No old tracing to compare Confirmed by MILLER  MD, BRIAN  [phone]) on 02/24/2015 4:41:48 PM      MDM   Final diagnoses:  CAP (community acquired pneumonia)    81 year old Caucasian female who reported past medical history of hypertension and COPD not on home oxygen who presents in setting of cough. 61 of history given by daughter-in-law as patient has dementia. Reportedly the last approximate week patient has had worsening cough which is now productive. They report cough is productive of whitish to yellowish sputum. She reports the cough has been "deeper" over the last several days for which they brought her to the emergency part. Patient had subjective fevers and shortness of  breath. Patient and family deny vision change, headaches, chest pain, abdominal pain, nausea, vomiting, conservation, diarrhea. She reports compliance with all medications. No recent sick contacts or recent travel.  On examination patient's oxygen saturations were in the low 90s on 2 L nasal cannula. Patient without tachypnea and resting comfortably on examination. Patient had mild end expiratory wheezing bilaterally. Patient did have a cough which was nonproductive while I was in the room.  In setting of the patient's presentation we'll obtain EKG, chest x-ray, basic laboratory analysis including ABG and lactate to rule out possible pneumonia versus upper respiratory infection versus COPD exacerbation. Additionally the patient given albuterol nebulized treatment and will reassess after labs, imaging, interventions.  Chest x-ray and imaging consistent with community-acquired pneumonia. Patient continues to have oxygen requirement. Considering finding patient be admitted to medicine for further management of community-acquired pneumonia and new O2  requirement. Patient given Rocephin and azithromycin. Patient stable at time of transfer care.  Attending seen and evaluated patient in Dr. Ammie Ferrier in agreement with plan.  Esaw Grandchild, MD 02/24/15 1820  Noemi Chapel, MD 02/25/15 [phone]

## 2015-02-24 NOTE — ED Provider Notes (Addendum)
HPI  SUBJECTIVE:  Rhonda Foley is a 81 y.o. female who presents with with worsening cough, fevers, wheezing, shortness of breath, increased work of breathing for the past 5 days. Family member states that the patient is acting more confused than usual, and is refusing to eat today. No known nausea or vomiting. Small amount of diarrhea. Family member has been giving her Mucinex, Robitussin. No other aggravating or alleviating factors. Patient lives by herself and has in-home caretaker. She sleeps alone at night. There is no known lower extremity edema, but patient is sleeping on 2 pillows which is unusual for her. No no nocturia, no weight gain, apparent abdominal pain. Patient did get a flu shot. She had her Pneumovax last year. She has a past medical history of COPD, lung fibrosis, cardiomyopathy, dementia, hypertension per chart review.. No known history of CHF, atrial fibrillation, diabetes,  MI, coronary artery disease.    Past Medical History  Diagnosis Date  . H/O: GI bleed 1993; 12/99  . HLD (hyperlipidemia) 1991  . HTN (hypertension) 1997  . History of UTI   . Dementia 06/2011    MMSE 14/30, thought senile although fmhx alz type; did not respond to aricept trial  . Cardiomegaly 04/2013    by CXR  . Thoracic compression fracture ([place]) 04/2013    by CXR  . COPD (chronic obstructive pulmonary disease) ([place]) 04/2013    by CXR  . Osteopenia 04/2013    by CXR    Past Surgical History  Procedure Laterality Date  . Ankle fracture surgery  1993  . Sigmoidoscopy  06/21/96  . Shoulder surgery  12/09    Right; fracture--Dr. Prescott Parma  . US echocardiography  04/2014    ER 55-60%, (mod MR, mod TR, mod elevated PA pressures    Family History  Problem Relation Age of Onset  . Other Father     stomach problem; terminal stomach pain  . Other Father     "lost his mind"  . Alzheimer's disease Mother   . Cancer Brother   . Cancer Sister   . Dementia Sister   . Hypertension Brother   .  Hypertension Sister     Social History  Substance Use Topics  . Smoking status: Never Smoker   . Smokeless tobacco: Never Used  . Alcohol Use: No    No current facility-administered medications for this encounter.  Current outpatient prescriptions:  .  acetaminophen (TYLENOL) 500 MG tablet, Take 500 mg by mouth 3 (three) times daily as needed., Disp: , Rfl:  .  Brompheniramine-Pseudoeph (DIMETAPP PO), Take 5 mLs by mouth daily as needed., Disp: , Rfl:  .  cetirizine (ZYRTEC) 10 MG tablet, Take 10 mg by mouth daily., Disp: , Rfl:  .  hydrochlorothiazide (MICROZIDE) 12.5 MG capsule, TAKE ONE CAPSULE BY MOUTH DAILY, Disp: 90 capsule, Rfl: 1 .  loratadine (CLARITIN) 10 MG tablet, Take 10 mg by mouth daily., Disp: , Rfl:  .  Multiple Vitamins-Minerals (MULTIVITAMIN PO), Take 1 tablet by mouth daily., Disp: , Rfl:   Allergies  Allergen Reactions  . Nsaids Nausea And Vomiting     ROS  As noted in HPI.   Physical Exam  BP 153/70 mmHg  Pulse 103  Temp(Src) 98.8 F (37.1 C) (Oral)  Resp 24  SpO2 89%   Patient sats on 3 recent visits 92-96%.  Constitutional: Well developed, well nourished, increased work of breathing with mild supraclavicular retractions, using of abdominal muscles Eyes: PERRL, EOMI, conjunctiva normal bilaterally HENT:  Normocephalic, atraumatic,mucus membranes moist Respiratory: Poor air movement, crackles throughout. Patient unable to tolerate lying flat. Neck:  No JVD. Cardiovascular: Regular tachycardia, no murmurs, no gallops, no rubs GI: Soft, nondistended, normal bowel sounds, nontender, no rebound, no guarding Back: no CVAT skin: No rash, skin intact Musculoskeletal: calves symmetric, No edema, no tenderness, no deformities Neurologic: Parents to commands appropriately. Nonverbal.  Psychiatric: Speech and behavior appropriate   ED Course   Medications - No data to display  Orders Placed This Encounter  Procedures  . Oxygen therapy Mode or  (Route): Nasal cannula; FiO2: 100%; Liters Per Minute: 4; Keep 02 saturation: >94%    Standing Status: Standing     Number of Occurrences: 1     Standing Expiration Date:     Order Specific Question:  Mode or (Route)    Answer:  Nasal cannula    Order Specific Question:  FiO2    Answer:  100%    Order Specific Question:  Liters Per Minute    Answer:  4    Order Specific Question:  Keep 02 saturation    Answer:  >94%   No results found for this or any previous visit (from the past 24 hour(s)). No results found.  ED Clinical Impression  Dyspnea  Wheezing   ED Assessment/Plan  O2 sat consistently 89-90%,  on room air  patient has crackles throughout lung bases, she has some mild supraclavicular retractions. She has a wet cough. She has no lower extremity edema however congestive heart failure is also the differential. Concern for pneumonia. Starting supplemental oxygen to maintain O2 sats above 94%. Transferring via EMS due to the increased work of breathing. Transferring to the ED for further evaluation.  *This clinic note was created using Dragon dictation software. Therefore, there may be occasional mistakes despite careful proofreading.  ?  Melynda Ripple, MD 02/24/15 [place], MD 02/24/15 1535

## 2015-02-24 NOTE — Telephone Encounter (Signed)
[facility]  Patient Name: Rhonda Foley  DOB: Aug 07, 1916    Initial Comment Caller states her mother in law has cough, congestion is worse   Nurse Assessment  Nurse: Harlow Mares, RN, Suanne Marker Date/Time [name] Time): 02/24/2015 9:33:13 AM  Confirm and document reason for call. If symptomatic, describe symptoms. ---Caller states her mother in law has cough, congestion is worse. Symptoms began last Wed, saw MD on Thursday, lungs were clear, no fever. Ran fever Sat night, cough is more constant and rattling in her chest. Productive cough, thick gray. Taking Mucinex, began Robittusin yesterday due to the cough. Having coughing spells. No fever today. Poor appetite. Drinking fluids.  Has the patient traveled out of the country within the last 30 days? ---No  Does the patient have any new or worsening symptoms? ---Yes  Will a triage be completed? ---Yes  Related visit to physician within the last 2 weeks? ---Yes  Does the PT have any chronic conditions? (i.e. diabetes, asthma, etc.) ---Yes  List chronic conditions. ---hypertension  Is this a behavioral health or substance abuse call? ---No     Guidelines    Guideline Title Affirmed Question Affirmed Notes  Cough - Acute Productive Wheezing is present    Final Disposition User   See Physician within 4 Hours (or PCP triage) Harlow Mares, RN, Rhonda    Referrals  GO TO [place] UNDECIDED   Disagree/Comply: Comply

## 2015-02-24 NOTE — ED Provider Notes (Signed)
Increased coughing, shortness of breath, mild generalized weakness. On exam the patient has mild diffuse expiratory wheezing, frequent coughing, rhonchi, no focal rales, appears comfortable otherwise, no edema. No tachycardia Thomas hypoxia apparent based on preliminary oxygen readings from pulse ox. Confirm with labs x-ray ABG, nebulized treatment.  I saw and evaluated the patient, reviewed the resident's note and I agree with the findings and plan.   EKG Interpretation  Date/Time:  Monday February 24 2015 16:25:53 EST Ventricular Rate:  101 PR Interval:  192 QRS Duration: 79 QT Interval:  316 QTC Calculation: 409 R Axis:   13 Text Interpretation:  Sinus tachycardia Atrial premature complex Low voltage, precordial leads Baseline wander in lead(s) III Left anterior fasicular block No old tracing to compare Confirmed by Cliffie Gingras  MD, [place] ([id number]) on 02/24/2015 4:41:48 PM       I personally interpreted the EKG as well as the resident and agree with the interpretation on the resident's chart.  Final diagnoses:  CAP (community acquired pneumonia)      Noemi Chapel, MD 02/25/15 1501

## 2015-02-25 DIAGNOSIS — J9601 Acute respiratory failure with hypoxia: Secondary | ICD-10-CM

## 2015-02-25 LAB — STREP PNEUMONIAE URINARY ANTIGEN
STREP PNEUMO URINARY ANTIGEN: NEGATIVE
Strep Pneumo Urinary Antigen: NEGATIVE

## 2015-02-25 MED ORDER — RISPERIDONE 1 MG/ML PO SOLN
0.5000 mg | Freq: Four times a day (QID) | ORAL | Status: DC | PRN
  Administered 2015-02-25 – 2015-02-26 (×3): 0.5 mg via ORAL
  Filled 2015-02-25 (×5): qty 0.5

## 2015-02-25 NOTE — Telephone Encounter (Signed)
Noted. Pt hospitalized with PNA.

## 2015-02-25 NOTE — Progress Notes (Signed)
SATURATION QUALIFICATIONS: (This note is used to comply with regulatory documentation for home oxygen)  Patient Saturations on Room Air at Rest =95%  Patient Saturations on Room Air while Ambulating = 83%  Patient Saturations on 1 Liters of oxygen while Ambulating = 95-100%  Please briefly explain why patient needs home oxygen: Pt. Only drops while ambulating, does well while resting. Will continue to monitor.

## 2015-02-25 NOTE — Progress Notes (Signed)
Pt BP 113/43 I was concern about the diastolic reading MD on call was paged about it , no new order given will continue to monitor

## 2015-02-25 NOTE — Progress Notes (Signed)
[facility] Progress Note   Rhonda Foley  [id number]  DOB: 02-18-1917  DOA: 02/24/2015 PCP: [name], MD  Brief narrative: Rhonda Foley is a 81 y.o. female with dementia and HTN who is brought in by her son and daughter in law for a cough that has been going on for 5-6 days now. Patient is confused and history is obtained from daughter in law.  She was seen by primary care on 12/22 and was recommended to take symptomatic treatment but the cough progressed. She is found to by hypoxic with a pulse ox of about 88-89% on room air. CXR sugesitve of possible RLL infiltrate.    Subjective: Cough is much better per granddaughter who is in the room. Patient is confused at baseline with severe short term memory loss.    Assessment/Plan: Principal Problem:   CAP (community acquired pneumonia)- acute hypoxic resp failure - cont Rocephin and Zithromax- wean O2  Active Problems:  HTN -cont HCTZ    Senile dementia - PRN Risperdal at bedtime for sundowning    Lung nodule, solitary - would not work this up due to her age - can follow with serial CTs as outpt if family insists  Code Status:     Code Status Orders        Start     Ordered   02/24/15 1902  Full code   Continuous     02/24/15 1901    Advance Directive Documentation        Most Recent Value   Type of Advance Directive  Healthcare Power of Attorney, Living will   Pre-existing out of facility DNR order (yellow form or pink MOST form)     "MOST" Form in Place?       Family Communication: daughter Disposition Plan: home when stable DVT prophylaxis: Lovenox Consultants: Procedures:   Antibiotics: Anti-infectives    Start     Dose/Rate Route Frequency Ordered Stop   02/25/15 2000  azithromycin (ZITHROMAX) tablet 500 mg     500 mg Oral Every 24 hours 02/24/15 2016 03/03/15 1959   02/25/15 1800  cefTRIAXone (ROCEPHIN) 1 g in dextrose 5 % 50 mL IVPB     1 g 100 mL/hr over 30 Minutes  Intravenous Every 24 hours 02/24/15 2017 03/03/15 1759   02/24/15 1915  cefTRIAXone (ROCEPHIN) 1 g in dextrose 5 % 50 mL IVPB  Status:  Discontinued     1 g 100 mL/hr over 30 Minutes Intravenous Every 24 hours 02/24/15 1901 02/24/15 2017   02/24/15 1915  azithromycin (ZITHROMAX) tablet 500 mg  Status:  Discontinued     500 mg Oral Every 24 hours 02/24/15 1901 02/24/15 2016   02/24/15 1815  cefTRIAXone (ROCEPHIN) 1 g in dextrose 5 % 50 mL IVPB     1 g 100 mL/hr over 30 Minutes Intravenous  Once 02/24/15 1804 02/24/15 1922   02/24/15 1815  azithromycin (ZITHROMAX) 500 mg in dextrose 5 % 250 mL IVPB     500 mg 250 mL/hr over 60 Minutes Intravenous  Once 02/24/15 1804 02/24/15 2103      Objective: Filed Weights   02/25/15 0551  Weight: 62.1 kg (136 lb 14.5 oz)   No intake or output data in the 24 hours ending 02/25/15 1208   Vitals Filed Vitals:   02/24/15 2024 02/24/15 2130 02/25/15 0551 02/25/15 0552  BP: 154/86  113/41 113/43  Pulse: 102  89 91  Temp: 98.4 F (36.9 C)  97.6 F (36.4 C)  TempSrc: Oral  Oral   Resp: 30  18   Height:   4\' 10"  (1.473 m)   Weight:   62.1 kg (136 lb 14.5 oz)   SpO2: 82% 92% 97% 95%    Exam:  General:  Pt is alert, not in acute distress  HEENT: No icterus, No thrush, oral mucosa moist  Cardiovascular: regular rate and rhythm, S1/S2 No murmur  Respiratory: clear to auscultation bilaterally   Abdomen: Soft, +Bowel sounds, non tender, non distended, no guarding  MSK: No LE edema, cyanosis or clubbing  Data Reviewed: Basic Metabolic Panel:  Recent Labs Lab 02/24/15 1745  NA 138  K 3.0*  CL 96*  CO2 29  GLUCOSE 148*  BUN 14  CREATININE 0.77  CALCIUM 9.5   Liver Function Tests: No results for input(s): AST, ALT, ALKPHOS, BILITOT, PROT, ALBUMIN in the last 168 hours. No results for input(s): LIPASE, AMYLASE in the last 168 hours. No results for input(s): AMMONIA in the last 168 hours. CBC:  Recent Labs Lab 02/24/15 1745   WBC 13.6*  NEUTROABS 9.7*  HGB 12.2  HCT 37.3  MCV 90.5  PLT 404*   Cardiac Enzymes: No results for input(s): CKTOTAL, CKMB, CKMBINDEX, TROPONINI in the last 168 hours. BNP (last 3 results) No results for input(s): BNP in the last 8760 hours.  ProBNP (last 3 results) No results for input(s): PROBNP in the last 8760 hours.  CBG: No results for input(s): GLUCAP in the last 168 hours.  Recent Results (from the past 240 hour(s))  Culture, blood (routine x 2) Call MD if unable to obtain prior to antibiotics being given     Status: None (Preliminary result)   Collection Time: 02/24/15  9:00 PM  Result Value Ref Range Status   Specimen Description BLOOD LEFT ANTECUBITAL  Final   Special Requests BOTTLES DRAWN AEROBIC ONLY 5CC  Final   Culture NO GROWTH < 12 HOURS  Final   Report Status PENDING  Incomplete  Culture, blood (routine x 2) Call MD if unable to obtain prior to antibiotics being given     Status: None (Preliminary result)   Collection Time: 02/24/15  9:15 PM  Result Value Ref Range Status   Specimen Description BLOOD LEFT HAND  Final   Special Requests IN PEDIATRIC BOTTLE 2CC  Final   Culture NO GROWTH < 12 HOURS  Final   Report Status PENDING  Incomplete     Studies: Dg Chest 2 View  02/24/2015  CLINICAL DATA:  Cough and short of breath EXAM: CHEST  2 VIEW COMPARISON:  04/09/2013 FINDINGS: Cardiac enlargement without heart failure or edema. 1 cm nodule overlying the right lung base, probably in the right middle lobe. This was not present previously. This could be a lung nodule or infiltrate. Thickening of the minor fissure possibly with mild infiltrate, this has developed since the prior study. Small right pleural effusion.  Left lung is clear IMPRESSION: Possible 1 cm nodule right middle lobe.  Pneumonia is also possible Thickening of the minor fissure with possible right upper lobe infiltrate adjacent to the minor fissure. CT chest without contrast suggested for  further  evaluation. Electronically Signed   By: Franchot Gallo M.D.   On: 02/24/2015 17:52    Scheduled Meds:  Scheduled Meds: . antiseptic oral rinse  7 mL Mouth Rinse q12n4p  . azithromycin  500 mg Oral Q24H  . cefTRIAXone (ROCEPHIN)  IV  1 g Intravenous Q24H  . chlorhexidine  15 mL Mouth  Rinse BID  . enoxaparin (LOVENOX) injection  40 mg Subcutaneous Q24H  . guaiFENesin  600 mg Oral BID  . hydrochlorothiazide  12.5 mg Oral Daily  . loratadine  10 mg Oral Daily  . sodium chloride  3 mL Intravenous Q12H   Continuous Infusions:   Time spent on care of this patient: 35 min   [place], MD 02/25/2015, 12:08 PM  LOS: 1 day   [facility] Office  [phone] Pager - Text Page per [email or web address] If 7PM-7AM, please contact night-coverage [email or web address]

## 2015-02-25 NOTE — Care Management Note (Signed)
Case Management Note  Patient Details  Name: Rhonda Foley MRN: [id number] Date of Birth: [birth year]  Subjective/Objective:                  Date-02-25-15 Initial Assessment Spoke with patient at the bedside along with son and daughter in law.  Introduced self as Tourist information centre manager and explained role in discharge planning and how to be reached.  Verified patient lives in [place] in a house alone  Verified patient anticipates to go home alone, at time of discharge and will have part-time supervision by niece who cares for her 7 hours a day M-F and daughter in law will cover weekends  at this time to best of their knowledge. Family cooks and cleans and drives patient Patient has DME. Expressed potential need for no other DME.  Patient denied  needing help with their medication.  Patient is driven by family to MD appointments.  Verified patient has PCP Guiterrez. Patient states they currently receive [place] services through no one.    Plan: CM will continue to follow for discharge planning and [facility] resources.   Carles Collet RN BSN CM [phone]   Action/Plan:  No CM needs identified at this time.   Expected Discharge Date:                  Expected Discharge Plan:  [place]  In-House Referral:     Discharge planning Services  CM Consult  Post Acute Care Choice:    Choice offered to:     DME Arranged:    DME Agency:     HH Arranged:    [place] Agency:     Status of Service:  In process, will continue to follow  Medicare Important Message Given:    Date Medicare IM Given:    Medicare IM give by:    Date Additional Medicare IM Given:    Additional Medicare Important Message give by:     If discussed at [place] of Stay Meetings, dates discussed:    Additional Comments:  Carles Collet, RN 02/25/2015, 3:45 PM

## 2015-02-25 NOTE — Telephone Encounter (Signed)
Per chart review pt was admitted to [facility] on 02/24/15.

## 2015-02-26 ENCOUNTER — Inpatient Hospital Stay (HOSPITAL_COMMUNITY): Payer: Medicare Other

## 2015-02-26 LAB — CBC
HEMATOCRIT: 33.6 % — AB (ref 36.0–46.0)
HEMOGLOBIN: 11 g/dL — AB (ref 12.0–15.0)
MCH: 29.6 pg (ref 26.0–34.0)
MCHC: 32.7 g/dL (ref 30.0–36.0)
MCV: 90.6 fL (ref 78.0–100.0)
Platelets: 372 10*3/uL (ref 150–400)
RBC: 3.71 MIL/uL — ABNORMAL LOW (ref 3.87–5.11)
RDW: 13.4 % (ref 11.5–15.5)
WBC: 11 10*3/uL — ABNORMAL HIGH (ref 4.0–10.5)

## 2015-02-26 LAB — BASIC METABOLIC PANEL
Anion gap: 10 (ref 5–15)
BUN: 10 mg/dL (ref 6–20)
CALCIUM: 9 mg/dL (ref 8.9–10.3)
CHLORIDE: 98 mmol/L — AB (ref 101–111)
CO2: 30 mmol/L (ref 22–32)
CREATININE: 0.67 mg/dL (ref 0.44–1.00)
GFR calc non Af Amer: >60 mL/min (ref >60)
GLUCOSE: 191 mg/dL — AB (ref 65–99)
Potassium: 3.6 mmol/L (ref 3.5–5.1)
Sodium: 138 mmol/L (ref 135–145)

## 2015-02-26 MED ORDER — ALBUTEROL SULFATE (2.5 MG/3ML) 0.083% IN NEBU: 2.5000 mg | INHALATION_SOLUTION | Freq: Four times a day (QID) | RESPIRATORY_TRACT | Status: DC

## 2015-02-26 MED ORDER — LORAZEPAM 2 MG/ML IJ SOLN
0.5000 mg | Freq: Once | INTRAMUSCULAR | Status: AC
  Administered 2015-02-26: 0.5 mg via INTRAVENOUS
  Filled 2015-02-26: qty 1

## 2015-02-26 MED ORDER — ALBUTEROL SULFATE (2.5 MG/3ML) 0.083% IN NEBU
2.5000 mg | INHALATION_SOLUTION | Freq: Three times a day (TID) | RESPIRATORY_TRACT | Status: DC
  Administered 2015-02-26 – 2015-02-28 (×6): 2.5 mg via RESPIRATORY_TRACT
  Filled 2015-02-26 (×6): qty 3

## 2015-02-26 NOTE — Progress Notes (Signed)
[facility] Progress Note   Rhonda Foley  [id number]  DOB: June 15, 1916  DOA: 02/24/2015 PCP: [name], MD  Brief narrative: Rhonda Foley is a 81 y.o. female with dementia and HTN who is brought in by her son and daughter in law for a cough that has been going on for 5-6 days now. Patient is confused and history is obtained from daughter in law.  She was seen by primary care on 12/22 and was recommended to take symptomatic treatment but the cough progressed. She is found to by hypoxic with a pulse ox of about 88-89% on room air. CXR sugesitve of possible RLL infiltrate.    Subjective: Coughing still. Daughter is worried that her breathing is worse today.   Assessment/Plan: Principal Problem:   CAP (community acquired pneumonia)- acute hypoxic resp failure - cont Rocephin and Zithromax- CXR showing worsening infiltrates however WBC count improving and now weaned down to room air- sounds congested- add routine nebs today   Active Problems:  HTN -cont HCTZ    Senile dementia - PRN Risperdal for sundowning    Lung nodule, solitary - would not work this up due to her age - can follow with serial CTs as outpt if family insists  Code Status:     Code Status Orders        Start     Ordered   02/24/15 1902  Full code   Continuous     02/24/15 1901    Advance Directive Documentation        Most Recent Value   Type of Advance Directive  Healthcare Power of Attorney, Living will   Pre-existing out of facility DNR order (yellow form or pink MOST form)     "MOST" Form in Place?       Family Communication: daughter Disposition Plan: home when stable DVT prophylaxis: Lovenox Consultants: Procedures:   Antibiotics: Anti-infectives    Start     Dose/Rate Route Frequency Ordered Stop   02/25/15 2000  azithromycin (ZITHROMAX) tablet 500 mg     500 mg Oral Every 24 hours 02/24/15 2016 03/03/15 1959   02/25/15 1800  cefTRIAXone (ROCEPHIN) 1 g in  dextrose 5 % 50 mL IVPB     1 g 100 mL/hr over 30 Minutes Intravenous Every 24 hours 02/24/15 2017 03/03/15 1759   02/24/15 1915  cefTRIAXone (ROCEPHIN) 1 g in dextrose 5 % 50 mL IVPB  Status:  Discontinued     1 g 100 mL/hr over 30 Minutes Intravenous Every 24 hours 02/24/15 1901 02/24/15 2017   02/24/15 1915  azithromycin (ZITHROMAX) tablet 500 mg  Status:  Discontinued     500 mg Oral Every 24 hours 02/24/15 1901 02/24/15 2016   02/24/15 1815  cefTRIAXone (ROCEPHIN) 1 g in dextrose 5 % 50 mL IVPB     1 g 100 mL/hr over 30 Minutes Intravenous  Once 02/24/15 1804 02/24/15 1922   02/24/15 1815  azithromycin (ZITHROMAX) 500 mg in dextrose 5 % 250 mL IVPB     500 mg 250 mL/hr over 60 Minutes Intravenous  Once 02/24/15 1804 02/24/15 2103      Objective: Filed Weights   02/25/15 0551  Weight: 62.1 kg (136 lb 14.5 oz)    Intake/Output Summary (Last 24 hours) at 02/26/15 1654 Last data filed at 02/26/15 1335  Gross per 24 hour  Intake   1080 ml  Output    350 ml  Net    730 ml  Vitals Filed Vitals:   02/25/15 0552 02/25/15 2033 02/26/15 0646 02/26/15 1333  BP: 113/43 143/65 163/63 141/78  Pulse: 91 111 109 109  Temp:  99 F (37.2 C) 99.9 F (37.7 C) 98.9 F (37.2 C)  TempSrc:  Oral Oral Oral  Resp:  18 20 20   Height:      Weight:      SpO2: 95% 93% 93% 94%    Exam:  General:  Pt is alert, not in acute distress  HEENT: No icterus, No thrush, oral mucosa moist  Cardiovascular: regular rate and rhythm, S1/S2 No murmur  Respiratory: crackles right lung field   Abdomen: Soft, +Bowel sounds, non tender, non distended, no guarding  MSK: No LE edema, cyanosis or clubbing  Data Reviewed: Basic Metabolic Panel:  Recent Labs Lab 02/24/15 1745 02/26/15 0917  NA 138 138  K 3.0* 3.6  CL 96* 98*  CO2 29 30  GLUCOSE 148* 191*  BUN 14 10  CREATININE 0.77 0.67  CALCIUM 9.5 9.0   Liver Function Tests: No results for input(s): AST, ALT, ALKPHOS, BILITOT, PROT,  ALBUMIN in the last 168 hours. No results for input(s): LIPASE, AMYLASE in the last 168 hours. No results for input(s): AMMONIA in the last 168 hours. CBC:  Recent Labs Lab 02/24/15 1745 02/26/15 0917  WBC 13.6* 11.0*  NEUTROABS 9.7*  --   HGB 12.2 11.0*  HCT 37.3 33.6*  MCV 90.5 90.6  PLT 404* 372   Cardiac Enzymes: No results for input(s): CKTOTAL, CKMB, CKMBINDEX, TROPONINI in the last 168 hours. BNP (last 3 results) No results for input(s): BNP in the last 8760 hours.  ProBNP (last 3 results) No results for input(s): PROBNP in the last 8760 hours.  CBG: No results for input(s): GLUCAP in the last 168 hours.  Recent Results (from the past 240 hour(s))  Culture, blood (routine x 2) Call MD if unable to obtain prior to antibiotics being given     Status: None (Preliminary result)   Collection Time: 02/24/15  9:00 PM  Result Value Ref Range Status   Specimen Description BLOOD LEFT ANTECUBITAL  Final   Special Requests BOTTLES DRAWN AEROBIC ONLY 5CC  Final   Culture NO GROWTH 2 DAYS  Final   Report Status PENDING  Incomplete  Culture, blood (routine x 2) Call MD if unable to obtain prior to antibiotics being given     Status: None (Preliminary result)   Collection Time: 02/24/15  9:15 PM  Result Value Ref Range Status   Specimen Description BLOOD LEFT HAND  Final   Special Requests IN PEDIATRIC BOTTLE 2CC  Final   Culture NO GROWTH 2 DAYS  Final   Report Status PENDING  Incomplete     Studies: Dg Chest 2 View  02/24/2015  CLINICAL DATA:  Cough and short of breath EXAM: CHEST  2 VIEW COMPARISON:  04/09/2013 FINDINGS: Cardiac enlargement without heart failure or edema. 1 cm nodule overlying the right lung base, probably in the right middle lobe. This was not present previously. This could be a lung nodule or infiltrate. Thickening of the minor fissure possibly with mild infiltrate, this has developed since the prior study. Small right pleural effusion.  Left lung is clear  IMPRESSION: Possible 1 cm nodule right middle lobe.  Pneumonia is also possible Thickening of the minor fissure with possible right upper lobe infiltrate adjacent to the minor fissure. CT chest without contrast suggested for  further evaluation. Electronically Signed   By: Juanda Crumble  Carlis Abbott M.D.   On: 02/24/2015 17:52   Dg Chest Port 1 View  02/26/2015  CLINICAL DATA:  Shortness of breath and cough for 7 days EXAM: PORTABLE CHEST 1 VIEW COMPARISON:  February 24, 2015 FINDINGS: The lungs are somewhat hyperexpanded. There is a persistent small right pleural effusion. There is hazy airspace opacity in the right mid and lower lung zones. The left lung is clear. Heart is mildly enlarged with pulmonary vascular within normal limits. No adenopathy. There is degenerative change in the thoracic spine. IMPRESSION: Patchy opacity in the right mid and lower lung zones, most likely due to pneumonia. Small right effusion. Stable cardiac prominence. Left lung clear. Lungs are somewhat hyperexpanded, stable. No new opacity compared to 2 days prior. Electronically Signed   By: Lowella Grip III M.D.   On: 02/26/2015 10:32    Scheduled Meds:  Scheduled Meds: . albuterol  2.5 mg Nebulization TID  . antiseptic oral rinse  7 mL Mouth Rinse q12n4p  . azithromycin  500 mg Oral Q24H  . cefTRIAXone (ROCEPHIN)  IV  1 g Intravenous Q24H  . chlorhexidine  15 mL Mouth Rinse BID  . enoxaparin (LOVENOX) injection  40 mg Subcutaneous Q24H  . guaiFENesin  600 mg Oral BID  . hydrochlorothiazide  12.5 mg Oral Daily  . loratadine  10 mg Oral Daily  . sodium chloride  3 mL Intravenous Q12H   Continuous Infusions:   Time spent on care of this patient: 35 min   [place], MD 02/26/2015, 4:54 PM  LOS: 2 days   [facility] Office  [phone] Pager - Text Page per [email or web address] If 7PM-7AM, please contact night-coverage [email or web address]

## 2015-02-27 NOTE — Care Management Important Message (Signed)
Important Message  Patient Details  Name: Rhonda Foley MRN: [id number] Date of Birth: 12/21/1916   Medicare Important Message Given:  Yes    [name] May 02/27/2015, 12:35 PM

## 2015-02-27 NOTE — Progress Notes (Signed)
[facility] Progress Note   Rhonda Foley  [id number]  DOB: 04/25/1916  DOA: 02/24/2015 PCP: [name], MD  Brief narrative: Rhonda Foley is a 81 y.o. female with dementia and HTN who is brought in by her son and daughter in law for a cough that has been going on for 5-6 days now. Patient is confused and history is obtained from daughter in law.  She was seen by primary care on 12/22 and was recommended to take symptomatic treatment but the cough progressed. She is found to by hypoxic with a pulse ox of about 88-89% on room air. CXR sugesitve of possible RLL infiltrate.    Subjective: Still quite congested and coughing.    Assessment/Plan: Principal Problem:   CAP (community acquired pneumonia)- acute hypoxic resp failure - cont Rocephin and Zithromax- cont Nebs for chest congestion- add flutter valve  - WBC count improving  Active Problems:  HTN -cont HCTZ    Senile dementia - PRN Risperdal at bedtime for sundowning    Lung nodule, solitary - would not work this up due to her age - can follow with serial CTs as outpt if family insists  Code Status:     Code Status Orders        Start     Ordered   02/24/15 1902  Full code   Continuous     02/24/15 1901    Advance Directive Documentation        Most Recent Value   Type of Advance Directive  Healthcare Power of Attorney, Living will   Pre-existing out of facility DNR order (yellow form or pink MOST form)     "MOST" Form in Place?       Family Communication: daughter Disposition Plan: home when stable DVT prophylaxis: Lovenox Consultants: Procedures:   Antibiotics: Anti-infectives    Start     Dose/Rate Route Frequency Ordered Stop   02/25/15 2000  azithromycin (ZITHROMAX) tablet 500 mg     500 mg Oral Every 24 hours 02/24/15 2016 03/03/15 1959   02/25/15 1800  cefTRIAXone (ROCEPHIN) 1 g in dextrose 5 % 50 mL IVPB     1 g 100 mL/hr over 30 Minutes Intravenous Every 24 hours  02/24/15 2017 03/03/15 1759   02/24/15 1915  cefTRIAXone (ROCEPHIN) 1 g in dextrose 5 % 50 mL IVPB  Status:  Discontinued     1 g 100 mL/hr over 30 Minutes Intravenous Every 24 hours 02/24/15 1901 02/24/15 2017   02/24/15 1915  azithromycin (ZITHROMAX) tablet 500 mg  Status:  Discontinued     500 mg Oral Every 24 hours 02/24/15 1901 02/24/15 2016   02/24/15 1815  cefTRIAXone (ROCEPHIN) 1 g in dextrose 5 % 50 mL IVPB     1 g 100 mL/hr over 30 Minutes Intravenous  Once 02/24/15 1804 02/24/15 1922   02/24/15 1815  azithromycin (ZITHROMAX) 500 mg in dextrose 5 % 250 mL IVPB     500 mg 250 mL/hr over 60 Minutes Intravenous  Once 02/24/15 1804 02/24/15 2103      Objective: Filed Weights   02/25/15 0551  Weight: 62.1 kg (136 lb 14.5 oz)    Intake/Output Summary (Last 24 hours) at 02/27/15 1523 Last data filed at 02/27/15 1001  Gross per 24 hour  Intake    340 ml  Output      0 ml  Net    340 ml     Vitals Filed Vitals:   02/26/15 2104 02/27/15 0803  02/27/15 1404 02/27/15 1443  BP: 132/66   126/76  Pulse: 97   98  Temp: 99.2 F (37.3 C)   97.8 F (36.6 C)  TempSrc: Oral   Oral  Resp: 20   19  Height:      Weight:      SpO2: 100% 98% 96% 98%    Exam:  General:  Pt is alert, not in acute distress  HEENT: No icterus, No thrush, oral mucosa moist  Cardiovascular: regular rate and rhythm, S1/S2 No murmur  Respiratory: rhonchi and mild wheeze b/l   Abdomen: Soft, +Bowel sounds, non tender, non distended, no guarding  MSK: No LE edema, cyanosis or clubbing  Data Reviewed: Basic Metabolic Panel:  Recent Labs Lab 02/24/15 1745 02/26/15 0917  NA 138 138  K 3.0* 3.6  CL 96* 98*  CO2 29 30  GLUCOSE 148* 191*  BUN 14 10  CREATININE 0.77 0.67  CALCIUM 9.5 9.0   Liver Function Tests: No results for input(s): AST, ALT, ALKPHOS, BILITOT, PROT, ALBUMIN in the last 168 hours. No results for input(s): LIPASE, AMYLASE in the last 168 hours. No results for input(s):  AMMONIA in the last 168 hours. CBC:  Recent Labs Lab 02/24/15 1745 02/26/15 0917  WBC 13.6* 11.0*  NEUTROABS 9.7*  --   HGB 12.2 11.0*  HCT 37.3 33.6*  MCV 90.5 90.6  PLT 404* 372   Cardiac Enzymes: No results for input(s): CKTOTAL, CKMB, CKMBINDEX, TROPONINI in the last 168 hours. BNP (last 3 results) No results for input(s): BNP in the last 8760 hours.  ProBNP (last 3 results) No results for input(s): PROBNP in the last 8760 hours.  CBG: No results for input(s): GLUCAP in the last 168 hours.  Recent Results (from the past 240 hour(s))  Culture, blood (routine x 2) Call MD if unable to obtain prior to antibiotics being given     Status: None (Preliminary result)   Collection Time: 02/24/15  9:00 PM  Result Value Ref Range Status   Specimen Description BLOOD LEFT ANTECUBITAL  Final   Special Requests BOTTLES DRAWN AEROBIC ONLY 5CC  Final   Culture NO GROWTH 3 DAYS  Final   Report Status PENDING  Incomplete  Culture, blood (routine x 2) Call MD if unable to obtain prior to antibiotics being given     Status: None (Preliminary result)   Collection Time: 02/24/15  9:15 PM  Result Value Ref Range Status   Specimen Description BLOOD LEFT HAND  Final   Special Requests IN PEDIATRIC BOTTLE 2CC  Final   Culture NO GROWTH 3 DAYS  Final   Report Status PENDING  Incomplete     Studies: Dg Chest Port 1 View  02/26/2015  CLINICAL DATA:  Shortness of breath and cough for 7 days EXAM: PORTABLE CHEST 1 VIEW COMPARISON:  February 24, 2015 FINDINGS: The lungs are somewhat hyperexpanded. There is a persistent small right pleural effusion. There is hazy airspace opacity in the right mid and lower lung zones. The left lung is clear. Heart is mildly enlarged with pulmonary vascular within normal limits. No adenopathy. There is degenerative change in the thoracic spine. IMPRESSION: Patchy opacity in the right mid and lower lung zones, most likely due to pneumonia. Small right effusion. Stable  cardiac prominence. Left lung clear. Lungs are somewhat hyperexpanded, stable. No new opacity compared to 2 days prior. Electronically Signed   By: Lowella Grip III M.D.   On: 02/26/2015 10:32    Scheduled Meds:  Scheduled Meds: . albuterol  2.5 mg Nebulization TID  . antiseptic oral rinse  7 mL Mouth Rinse q12n4p  . azithromycin  500 mg Oral Q24H  . cefTRIAXone (ROCEPHIN)  IV  1 g Intravenous Q24H  . chlorhexidine  15 mL Mouth Rinse BID  . enoxaparin (LOVENOX) injection  40 mg Subcutaneous Q24H  . guaiFENesin  600 mg Oral BID  . hydrochlorothiazide  12.5 mg Oral Daily  . loratadine  10 mg Oral Daily  . sodium chloride  3 mL Intravenous Q12H   Continuous Infusions:   Time spent on care of this patient: 35 min   [place], MD 02/27/2015, 3:23 PM  LOS: 3 days   [facility] Office  [phone] Pager - Text Page per [email or web address] If 7PM-7AM, please contact night-coverage [email or web address]

## 2015-02-27 NOTE — Care Management Important Message (Signed)
Important Message  Patient Details  Name: Rhonda Foley MRN: [id number] Date of Birth: [birth year]   Medicare Important Message Given:  Yes    [name] May 02/27/2015, 12:35 PM

## 2015-02-28 MED ORDER — ALBUTEROL SULFATE HFA 108 (90 BASE) MCG/ACT IN AERS: 2.0000 | INHALATION_SPRAY | Freq: Four times a day (QID) | RESPIRATORY_TRACT | Status: DC | PRN

## 2015-02-28 MED ORDER — LEVOFLOXACIN 750 MG PO TABS: 750.0000 mg | ORAL_TABLET | ORAL | Status: DC

## 2015-02-28 MED ORDER — LEVOFLOXACIN 750 MG PO TABS: 750.0000 mg | ORAL_TABLET | Freq: Every day | ORAL | Status: DC

## 2015-02-28 MED ORDER — ZOLPIDEM TARTRATE 5 MG PO TABS
5.0000 mg | ORAL_TABLET | Freq: Once | ORAL | Status: AC
  Administered 2015-02-28: 5 mg via ORAL
  Filled 2015-02-28: qty 1

## 2015-02-28 MED ORDER — GUAIFENESIN ER 600 MG PO TB12: 600.0000 mg | ORAL_TABLET | Freq: Two times a day (BID) | ORAL | Status: DC

## 2015-02-28 NOTE — Discharge Summary (Addendum)
Physician Discharge Summary  MCKAYA LYNG [id number] DOB: [birth year] DOA: 02/24/2015  PCP: [name], MD  Admit date: 02/24/2015 Discharge date: 02/28/2015  Time spent: 50 minutes  Recommendations for Outpatient Follow-up:  1. Repeat CXR in 2 wks  Discharge Condition: stable    Discharge Diagnoses:  Principal Problem:   Acute respiratory failure with hypoxia (HCC) Active Problems:   Essential hypertension   Senile dementia   Cardiomegaly   CAP (community acquired pneumonia)   Lung nodule, solitary   History of present illness:  Rhonda Foley is a 81 y.o. female with dementia and HTN who is brought in by her son and daughter in law for a cough that has been going on for 5-6 days now. Patient is confused and history is obtained from daughter in law.  She was seen by primary care on 12/22 and was recommended to take symptomatic treatment but the cough progressed. She is found to by hypoxic with a pulse ox of about 88-89% on room air. CXR sugesitve of possible RLL infiltrate.   Hospital Course:  Principal Problem:  CAP (community acquired pneumonia)- acute hypoxic resp failure - right sided infiltrate- see xrays below - has been weaned to room air  -has been on Rocephin and Zithromax - transition to Levaquin today -  Nebs for chest congestion have been very helpful- will order PRN Albuterol inhaler on d/c -  added flutter valve  - WBC count improving  Active Problems:  HTN -cont HCTZ   Senile dementia - PRN Risperdal at bedtime for sundowning given in the hospital     Lung nodule, solitary - would not work this up due to her age - can follow with serial CTs as outpt if family insists  Procedures:    Consultations: None  Discharge Exam: Filed Weights   02/25/15 0551  Weight: 62.1 kg (136 lb 14.5 oz)   Filed Vitals:   02/27/15 1443 02/27/15 2110  BP: 126/76 136/64  Pulse: 98 98  Temp: 97.8 F (36.6 C) 98.5 F (36.9 C)  Resp: 19 32     General: AAO x 3, no distress Cardiovascular: RRR, no murmurs  Respiratory: clear to auscultation bilaterally GI: soft, non-tender, non-distended, bowel sound positive  Discharge Instructions You were cared for by a hospitalist during your hospital stay. If you have any questions about your discharge medications or the care you received while you were in the hospital after you are discharged, you can call the unit and asked to speak with the hospitalist on call if the hospitalist that took care of you is not available. Once you are discharged, your primary care physician will handle any further medical issues. Please note that NO REFILLS for any discharge medications will be authorized once you are discharged, as it is imperative that you return to your primary care physician (or establish a relationship with a primary care physician if you do not have one) for your aftercare needs so that they can reassess your need for medications and monitor your lab values.      Discharge Instructions    Diet - low sodium heart healthy    Complete by:  As directed      Increase activity slowly    Complete by:  As directed             Medication List    STOP taking these medications        guaiFENesin 100 MG/5ML liquid  Commonly known as:  ROBITUSSIN  Replaced  by:  guaiFENesin 600 MG 12 hr tablet     pseudoephedrine-guaifenesin 60-600 MG 12 hr tablet  Commonly known as:  MUCINEX D      TAKE these medications        acetaminophen 500 MG tablet  Commonly known as:  TYLENOL  Take 1,000 mg by mouth 2 (two) times daily as needed for fever (pain).     albuterol 108 (90 Base) MCG/ACT inhaler  Commonly known as:  PROVENTIL HFA;VENTOLIN HFA  Inhale 2 puffs into the lungs every 6 (six) hours as needed for wheezing or shortness of breath.     guaiFENesin 600 MG 12 hr tablet  Commonly known as:  MUCINEX  Take 1 tablet (600 mg total) by mouth 2 (two) times daily.     hydrochlorothiazide 12.5  MG capsule  Commonly known as:  MICROZIDE  TAKE ONE CAPSULE BY MOUTH DAILY     levofloxacin 750 MG tablet  Commonly known as:  LEVAQUIN  Take 1 tablet (750 mg total) by mouth daily.     loratadine 10 MG tablet  Commonly known as:  CLARITIN  Take 10 mg by mouth daily.     multivitamin with minerals Tabs tablet  Take 1 tablet by mouth daily.       Allergies  Allergen Reactions  . Nsaids Nausea And Vomiting and Other (See Comments)    Pt is not supposed to take because of GI bleed      The results of significant diagnostics from this hospitalization (including imaging, microbiology, ancillary and laboratory) are listed below for reference.    Significant Diagnostic Studies: Dg Chest 2 View  02/24/2015  CLINICAL DATA:  Cough and short of breath EXAM: CHEST  2 VIEW COMPARISON:  04/09/2013 FINDINGS: Cardiac enlargement without heart failure or edema. 1 cm nodule overlying the right lung base, probably in the right middle lobe. This was not present previously. This could be a lung nodule or infiltrate. Thickening of the minor fissure possibly with mild infiltrate, this has developed since the prior study. Small right pleural effusion.  Left lung is clear IMPRESSION: Possible 1 cm nodule right middle lobe.  Pneumonia is also possible Thickening of the minor fissure with possible right upper lobe infiltrate adjacent to the minor fissure. CT chest without contrast suggested for  further evaluation. Electronically Signed   By: Franchot Gallo M.D.   On: 02/24/2015 17:52   Ct Head Wo Contrast  02/16/2015  CLINICAL DATA:  Tripped on a rug and fell face first onto concrete floor, off LEFT frontal laceration, swelling and bruising to nose, dementia, hypertension, initial encounter EXAM: CT HEAD WITHOUT CONTRAST CT MAXILLOFACIAL WITHOUT CONTRAST CT CERVICAL SPINE WITHOUT CONTRAST TECHNIQUE: Multidetector CT imaging of the head, cervical spine, and maxillofacial structures were performed using the  standard protocol without intravenous contrast. Multiplanar CT image reconstructions of the cervical spine and maxillofacial structures were also generated. Right side of face marked with BB. COMPARISON:  CT head and maxillofacial exams of 04/29/2014 FINDINGS: CT HEAD FINDINGS Generalized atrophy. Normal ventricular morphology. No midline shift or mass effect. Small vessel chronic ischemic changes of deep cerebral white matter. No intracranial hemorrhage or evidence acute infarction. Calcified nodule adjacent to the RIGHT lateral aspect of the posterior falx 10 mm diameter question small calcified meningioma. No other intracranial mass lesions. Bones demineralized without fracture. LEFT frontal scalp soft tissue swelling with few foci of gas from known laceration. CT MAXILLOFACIAL FINDINGS Visualized intracranial structures as noted above. Small LEFT supraorbital  scalp hematoma with tiny foci of gas consistent with laceration. Minimally displaced RIGHT nasal bone fracture. BILATERAL nasal soft tissue swelling. Bones demineralized. Question polyp within RIGHT nasal passage 17 x 12 mm image 40. Patient edentulous. Mucosal thickening and small echoes retention cyst in RIGHT maxillary sinus. Remaining paranasal sinuses, mastoid air cells and middle ear cavities clear bilaterally. No additional facial bone fractures identified. CT CERVICAL SPINE FINDINGS Diffuse osseous demineralization. Disc space narrowing with bulky endplate spur formation anteriorly at C6-C7. Mild disc space narrowing and endplate spur formation at C4-C5 and C5-C6. Minimal anterior height loss of T1 vertebral body, without acute fracture plane, likely old. Remaining vertebral body heights maintained. Visualized skullbase intact. No additional fracture, subluxation or bone destruction. Multilevel facet degenerative changes of the cervical spine. IMPRESSION: Atrophy with small vessel chronic ischemic changes of deep cerebral white matter. No acute  intracranial abnormalities. Questionable 10 mm calcified meningioma at RIGHT lateral aspect of falx versus dural calcification. Tiny distal RIGHT nasal bone fracture. Question polyp within RIGHT nasal passages 17 x 12 mm. Degenerative disc and facet disease changes of the cervical spine with suspected old S minimal superior endplate compression deformity of T1 vertebral body. No definite acute cervical spine abnormalities. Electronically Signed   By: Lavonia Dana M.D.   On: 02/16/2015 16:27   Ct Cervical Spine Wo Contrast  02/16/2015  CLINICAL DATA:  Tripped on a rug and fell face first onto concrete floor, off LEFT frontal laceration, swelling and bruising to nose, dementia, hypertension, initial encounter EXAM: CT HEAD WITHOUT CONTRAST CT MAXILLOFACIAL WITHOUT CONTRAST CT CERVICAL SPINE WITHOUT CONTRAST TECHNIQUE: Multidetector CT imaging of the head, cervical spine, and maxillofacial structures were performed using the standard protocol without intravenous contrast. Multiplanar CT image reconstructions of the cervical spine and maxillofacial structures were also generated. Right side of face marked with BB. COMPARISON:  CT head and maxillofacial exams of 04/29/2014 FINDINGS: CT HEAD FINDINGS Generalized atrophy. Normal ventricular morphology. No midline shift or mass effect. Small vessel chronic ischemic changes of deep cerebral white matter. No intracranial hemorrhage or evidence acute infarction. Calcified nodule adjacent to the RIGHT lateral aspect of the posterior falx 10 mm diameter question small calcified meningioma. No other intracranial mass lesions. Bones demineralized without fracture. LEFT frontal scalp soft tissue swelling with few foci of gas from known laceration. CT MAXILLOFACIAL FINDINGS Visualized intracranial structures as noted above. Small LEFT supraorbital scalp hematoma with tiny foci of gas consistent with laceration. Minimally displaced RIGHT nasal bone fracture. BILATERAL nasal soft  tissue swelling. Bones demineralized. Question polyp within RIGHT nasal passage 17 x 12 mm image 40. Patient edentulous. Mucosal thickening and small echoes retention cyst in RIGHT maxillary sinus. Remaining paranasal sinuses, mastoid air cells and middle ear cavities clear bilaterally. No additional facial bone fractures identified. CT CERVICAL SPINE FINDINGS Diffuse osseous demineralization. Disc space narrowing with bulky endplate spur formation anteriorly at C6-C7. Mild disc space narrowing and endplate spur formation at C4-C5 and C5-C6. Minimal anterior height loss of T1 vertebral body, without acute fracture plane, likely old. Remaining vertebral body heights maintained. Visualized skullbase intact. No additional fracture, subluxation or bone destruction. Multilevel facet degenerative changes of the cervical spine. IMPRESSION: Atrophy with small vessel chronic ischemic changes of deep cerebral white matter. No acute intracranial abnormalities. Questionable 10 mm calcified meningioma at RIGHT lateral aspect of falx versus dural calcification. Tiny distal RIGHT nasal bone fracture. Question polyp within RIGHT nasal passages 17 x 12 mm. Degenerative disc and facet disease changes of  the cervical spine with suspected old S minimal superior endplate compression deformity of T1 vertebral body. No definite acute cervical spine abnormalities. Electronically Signed   By: Lavonia Dana M.D.   On: 02/16/2015 16:27   Dg Chest Port 1 View  02/26/2015  CLINICAL DATA:  Shortness of breath and cough for 7 days EXAM: PORTABLE CHEST 1 VIEW COMPARISON:  February 24, 2015 FINDINGS: The lungs are somewhat hyperexpanded. There is a persistent small right pleural effusion. There is hazy airspace opacity in the right mid and lower lung zones. The left lung is clear. Heart is mildly enlarged with pulmonary vascular within normal limits. No adenopathy. There is degenerative change in the thoracic spine. IMPRESSION: Patchy opacity in  the right mid and lower lung zones, most likely due to pneumonia. Small right effusion. Stable cardiac prominence. Left lung clear. Lungs are somewhat hyperexpanded, stable. No new opacity compared to 2 days prior. Electronically Signed   By: Lowella Grip III M.D.   On: 02/26/2015 10:32   Ct Maxillofacial Wo Cm  02/16/2015  CLINICAL DATA:  Tripped on a rug and fell face first onto concrete floor, off LEFT frontal laceration, swelling and bruising to nose, dementia, hypertension, initial encounter EXAM: CT HEAD WITHOUT CONTRAST CT MAXILLOFACIAL WITHOUT CONTRAST CT CERVICAL SPINE WITHOUT CONTRAST TECHNIQUE: Multidetector CT imaging of the head, cervical spine, and maxillofacial structures were performed using the standard protocol without intravenous contrast. Multiplanar CT image reconstructions of the cervical spine and maxillofacial structures were also generated. Right side of face marked with BB. COMPARISON:  CT head and maxillofacial exams of 04/29/2014 FINDINGS: CT HEAD FINDINGS Generalized atrophy. Normal ventricular morphology. No midline shift or mass effect. Small vessel chronic ischemic changes of deep cerebral white matter. No intracranial hemorrhage or evidence acute infarction. Calcified nodule adjacent to the RIGHT lateral aspect of the posterior falx 10 mm diameter question small calcified meningioma. No other intracranial mass lesions. Bones demineralized without fracture. LEFT frontal scalp soft tissue swelling with few foci of gas from known laceration. CT MAXILLOFACIAL FINDINGS Visualized intracranial structures as noted above. Small LEFT supraorbital scalp hematoma with tiny foci of gas consistent with laceration. Minimally displaced RIGHT nasal bone fracture. BILATERAL nasal soft tissue swelling. Bones demineralized. Question polyp within RIGHT nasal passage 17 x 12 mm image 40. Patient edentulous. Mucosal thickening and small echoes retention cyst in RIGHT maxillary sinus. Remaining  paranasal sinuses, mastoid air cells and middle ear cavities clear bilaterally. No additional facial bone fractures identified. CT CERVICAL SPINE FINDINGS Diffuse osseous demineralization. Disc space narrowing with bulky endplate spur formation anteriorly at C6-C7. Mild disc space narrowing and endplate spur formation at C4-C5 and C5-C6. Minimal anterior height loss of T1 vertebral body, without acute fracture plane, likely old. Remaining vertebral body heights maintained. Visualized skullbase intact. No additional fracture, subluxation or bone destruction. Multilevel facet degenerative changes of the cervical spine. IMPRESSION: Atrophy with small vessel chronic ischemic changes of deep cerebral white matter. No acute intracranial abnormalities. Questionable 10 mm calcified meningioma at RIGHT lateral aspect of falx versus dural calcification. Tiny distal RIGHT nasal bone fracture. Question polyp within RIGHT nasal passages 17 x 12 mm. Degenerative disc and facet disease changes of the cervical spine with suspected old S minimal superior endplate compression deformity of T1 vertebral body. No definite acute cervical spine abnormalities. Electronically Signed   By: Lavonia Dana M.D.   On: 02/16/2015 16:27    Microbiology: Recent Results (from the past 240 hour(s))  Culture, blood (routine x  2) Call MD if unable to obtain prior to antibiotics being given     Status: None (Preliminary result)   Collection Time: 02/24/15  9:00 PM  Result Value Ref Range Status   Specimen Description BLOOD LEFT ANTECUBITAL  Final   Special Requests BOTTLES DRAWN AEROBIC ONLY 5CC  Final   Culture NO GROWTH 3 DAYS  Final   Report Status PENDING  Incomplete  Culture, blood (routine x 2) Call MD if unable to obtain prior to antibiotics being given     Status: None (Preliminary result)   Collection Time: 02/24/15  9:15 PM  Result Value Ref Range Status   Specimen Description BLOOD LEFT HAND  Final   Special Requests IN  PEDIATRIC BOTTLE 2CC  Final   Culture NO GROWTH 3 DAYS  Final   Report Status PENDING  Incomplete     Labs: Basic Metabolic Panel:  Recent Labs Lab 02/24/15 1745 02/26/15 0917  NA 138 138  K 3.0* 3.6  CL 96* 98*  CO2 29 30  GLUCOSE 148* 191*  BUN 14 10  CREATININE 0.77 0.67  CALCIUM 9.5 9.0   Liver Function Tests: No results for input(s): AST, ALT, ALKPHOS, BILITOT, PROT, ALBUMIN in the last 168 hours. No results for input(s): LIPASE, AMYLASE in the last 168 hours. No results for input(s): AMMONIA in the last 168 hours. CBC:  Recent Labs Lab 02/24/15 1745 02/26/15 0917  WBC 13.6* 11.0*  NEUTROABS 9.7*  --   HGB 12.2 11.0*  HCT 37.3 33.6*  MCV 90.5 90.6  PLT 404* 372   Cardiac Enzymes: No results for input(s): CKTOTAL, CKMB, CKMBINDEX, TROPONINI in the last 168 hours. BNP: BNP (last 3 results) No results for input(s): BNP in the last 8760 hours.  ProBNP (last 3 results) No results for input(s): PROBNP in the last 8760 hours.  CBG: No results for input(s): GLUCAP in the last 168 hours.     SignedDebbe Odea, MD [facility] 02/28/2015, 11:01 AM

## 2015-02-28 NOTE — Care Management Note (Signed)
Case Management Note  Patient Details  Name: CHIAKI SWAINE MRN: [id number] Date of Birth: 08/12/1916  Subjective/Objective:                  Date-02-25-15 Initial Assessment Spoke with patient at the bedside along with son and daughter in law.  Introduced self as Tourist information centre manager and explained role in discharge planning and how to be reached.  Verified patient lives in [place] in a house alone  Verified patient anticipates to go home alone, at time of discharge and will have part-time supervision by niece who cares for her 7 hours a day M-F and daughter in law will cover weekends at this time to best of their knowledge. Family cooks and cleans and drives patient Patient has DME. Expressed potential need for no other DME.  Patient denied needing help with their medication.  Patient is driven by family to MD appointments.  Verified patient has PCP Guiterrez. Patient states they currently receive [employer] services through no one.    Action/Plan:  81 year old patient who has extensive family support, and is independently ambulatory at baseline. Family denies any HH necessity or DME needs. Will DC to home with family today.  Expected Discharge Date:                  Expected Discharge Plan:  [place]  In-House Referral:     Discharge planning Services  CM Consult  Post Acute Care Choice:    Choice offered to:  Adult Children  DME Arranged:    DME Agency:     HH Arranged:    HH Agency:     Status of Service:  Completed, signed off  Medicare Important Message Given:  Yes Date Medicare IM Given:    Medicare IM give by:    Date Additional Medicare IM Given:    Additional Medicare Important Message give by:     If discussed at [place] of Stay Meetings, dates discussed:    Additional Comments:  Carles Collet, RN 02/28/2015, 10:50 AM

## 2015-02-28 NOTE — Progress Notes (Signed)
[name] to be D/C'd to home per MD order.  Discussed with the patient and all questions fully answered.  VSS, Skin clean, dry and intact without evidence of skin break down, no evidence of skin tears noted. IV catheter discontinued intact. Site without signs and symptoms of complications. Dressing and pressure applied.  An After Visit Summary was printed and given to the patient. Patient received prescriptions.  D/c education completed with patient/family including follow up instructions, medication list, d/c activities limitations if indicated, with other d/c instructions as indicated by MD - patient's caregiver able to verbalize understanding, all questions fully answered.   Patient instructed to return to ED, call 911, or call MD for any changes in condition.   Patient escorted via [place], and D/C home via private auto.  Renn Dirocco L Price 02/28/2015 11:22 AM

## 2015-03-01 LAB — CULTURE, BLOOD (ROUTINE X 2)
Culture: NO GROWTH
Culture: NO GROWTH

## 2015-03-04 ENCOUNTER — Telehealth: Payer: Self-pay | Admitting: *Deleted

## 2015-03-04 NOTE — Telephone Encounter (Signed)
Transition Care Management Follow-up Telephone Call   Date discharged? 02/28/15   How have you been since you were released from the hospital? Cough and breathing improved.   Do you understand why you were in the hospital? yes   Do you understand the discharge instructions? yes   Where were you discharged to? home   Items Reviewed:  Medications reviewed: yes  Allergies reviewed: yes  Dietary changes reviewed: no  Referrals reviewed: no   Functional Questionnaire:   Activities of Daily Living (ADLs):   She states they are independent in the following: ambulation, feeding, continence, grooming, toileting and dressing States they require assistance with the following: bathing and hygiene   Any transportation issues/concerns?: no   Any patient concerns? no   Confirmed importance and date/time of follow-up visits scheduled yes, 03/07/15 @ 1400  Provider Appointment booked with [name], MD  Confirmed with patient if condition begins to worsen call PCP or go to the ER.  Patient was given the office number and encouraged to call back with question or concerns.  : yes

## 2015-03-06 ENCOUNTER — Emergency Department (HOSPITAL_COMMUNITY): Payer: PPO

## 2015-03-06 ENCOUNTER — Encounter (HOSPITAL_COMMUNITY): Payer: Self-pay | Admitting: *Deleted

## 2015-03-06 ENCOUNTER — Telehealth: Payer: Self-pay | Admitting: Family Medicine

## 2015-03-06 ENCOUNTER — Emergency Department (HOSPITAL_COMMUNITY)
Admission: EM | Admit: 2015-03-06 | Discharge: 2015-03-06 | Disposition: A | Discharge status: Home / Self Care | Payer: PPO | Attending: Emergency Medicine | Admitting: Emergency Medicine

## 2015-03-06 DIAGNOSIS — Y998 Other external cause status: Secondary | ICD-10-CM | POA: Diagnosis not present

## 2015-03-06 DIAGNOSIS — Y9289 Other specified places as the place of occurrence of the external cause: Secondary | ICD-10-CM | POA: Diagnosis not present

## 2015-03-06 DIAGNOSIS — J449 Chronic obstructive pulmonary disease, unspecified: Secondary | ICD-10-CM | POA: Insufficient documentation

## 2015-03-06 DIAGNOSIS — X58XXXA Exposure to other specified factors, initial encounter: Secondary | ICD-10-CM | POA: Insufficient documentation

## 2015-03-06 DIAGNOSIS — S0083XA Contusion of other part of head, initial encounter: Secondary | ICD-10-CM | POA: Diagnosis not present

## 2015-03-06 DIAGNOSIS — Y9389 Activity, other specified: Secondary | ICD-10-CM | POA: Diagnosis not present

## 2015-03-06 DIAGNOSIS — K5730 Diverticulosis of large intestine without perforation or abscess without bleeding: Secondary | ICD-10-CM | POA: Diagnosis not present

## 2015-03-06 DIAGNOSIS — K5900 Constipation, unspecified: Secondary | ICD-10-CM | POA: Insufficient documentation

## 2015-03-06 DIAGNOSIS — R1031 Right lower quadrant pain: Secondary | ICD-10-CM

## 2015-03-06 DIAGNOSIS — J159 Unspecified bacterial pneumonia: Secondary | ICD-10-CM | POA: Insufficient documentation

## 2015-03-06 DIAGNOSIS — N390 Urinary tract infection, site not specified: Secondary | ICD-10-CM | POA: Insufficient documentation

## 2015-03-06 DIAGNOSIS — I10 Essential (primary) hypertension: Secondary | ICD-10-CM | POA: Diagnosis not present

## 2015-03-06 DIAGNOSIS — F0390 Unspecified dementia without behavioral disturbance: Secondary | ICD-10-CM | POA: Insufficient documentation

## 2015-03-06 DIAGNOSIS — Z8781 Personal history of (healed) traumatic fracture: Secondary | ICD-10-CM | POA: Diagnosis not present

## 2015-03-06 DIAGNOSIS — Z79899 Other long term (current) drug therapy: Secondary | ICD-10-CM | POA: Diagnosis not present

## 2015-03-06 LAB — COMPREHENSIVE METABOLIC PANEL
ALBUMIN: 2.9 g/dL — AB (ref 3.5–5.0)
ALT: 21 U/L (ref 14–54)
ANION GAP: 10 (ref 5–15)
AST: 23 U/L (ref 15–41)
Alkaline Phosphatase: 70 U/L (ref 38–126)
BILIRUBIN TOTAL: 0.5 mg/dL (ref 0.3–1.2)
BUN: 19 mg/dL (ref 6–20)
CO2: 25 mmol/L (ref 22–32)
Calcium: 9.4 mg/dL (ref 8.9–10.3)
Chloride: 104 mmol/L (ref 101–111)
Creatinine, Ser: 0.7 mg/dL (ref 0.44–1.00)
GFR calc Af Amer: >60 mL/min (ref >60)
GLUCOSE: 104 mg/dL — AB (ref 65–99)
POTASSIUM: 4 mmol/L (ref 3.5–5.1)
Sodium: 139 mmol/L (ref 135–145)
TOTAL PROTEIN: 6.5 g/dL (ref 6.5–8.1)

## 2015-03-06 LAB — URINALYSIS, ROUTINE W REFLEX MICROSCOPIC
Bilirubin Urine: NEGATIVE
Glucose, UA: NEGATIVE mg/dL
Hgb urine dipstick: NEGATIVE
Ketones, ur: NEGATIVE mg/dL
LEUKOCYTES UA: NEGATIVE
NITRITE: NEGATIVE
PH: 5.5 (ref 5.0–8.0)
Protein, ur: NEGATIVE mg/dL
SPECIFIC GRAVITY, URINE: 1.031 — AB (ref 1.005–1.030)

## 2015-03-06 LAB — CBC
HEMATOCRIT: 38.3 % (ref 36.0–46.0)
HEMOGLOBIN: 12.6 g/dL (ref 12.0–15.0)
MCH: 30 pg (ref 26.0–34.0)
MCHC: 32.9 g/dL (ref 30.0–36.0)
MCV: 91.2 fL (ref 78.0–100.0)
Platelets: 505 10*3/uL — ABNORMAL HIGH (ref 150–400)
RBC: 4.2 MIL/uL (ref 3.87–5.11)
RDW: 13.6 % (ref 11.5–15.5)
WBC: 12 10*3/uL — AB (ref 4.0–10.5)

## 2015-03-06 LAB — LIPASE, BLOOD: Lipase: 25 U/L (ref 11–51)

## 2015-03-06 MED ORDER — IOHEXOL 300 MG/ML  SOLN
80.0000 mL | Freq: Once | INTRAMUSCULAR | Status: AC | PRN
  Administered 2015-03-06: 80 mL via INTRAVENOUS

## 2015-03-06 NOTE — ED Notes (Signed)
Pt is in stable condition upon d/c and is escorted from ED via wheelchair. 

## 2015-03-06 NOTE — Telephone Encounter (Signed)
From [facility] note pt agreed to go to [facility] ED; pt has hospital f/u appt with Dr Darnell Level on 03/07/15.

## 2015-03-06 NOTE — ED Notes (Signed)
Pt woke up with RLQ pain since this am and tender to touch.  Hurts with walking and patient has her appendix. Not sure about if diarrhea, no constipation, caregiver said she had a bowel movement yesterday but not observed

## 2015-03-06 NOTE — Discharge Instructions (Signed)

## 2015-03-06 NOTE — ED Notes (Signed)
Patient transported to CT 

## 2015-03-06 NOTE — Telephone Encounter (Signed)
NOTE: All timestamps contained within this report are represented as [place] Standard Time. CONFIDENTIALTY NOTICE: This fax transmission is intended only for the addressee. It contains information that is legally privileged, confidential or otherwise protected from use or disclosure. If you are not the intended recipient, you are strictly prohibited from reviewing, disclosing, copying using or disseminating any of this information or taking any action in reliance on or regarding this information. If you have received this fax in error, please notify us immediately by telephone so that we can arrange for its return to [place]. Phone: [phone], Toll-Free: [phone], Fax: [phone] Page: 1 of 1 Call Id: [id number] [place] Patient Name: Rhonda Foley DOB: 1916/07/30 Initial Comment Caller states patient was in the hospital fro pneumonia; was put on a medication and is fine; appetite decreased slightly; pain in her right side; painful to touch; 98.4--Signal is terrible call both numbers Nurse Assessment Nurse: [name], RN, [name] Date/Time [name] Time): 03/06/2015 9:50:52 AM Confirm and document reason for call. If symptomatic, describe symptoms. ---SHE WAS IN THE HOSPITAL FOR PNEUMONIA. SHE GOT UP THIS MORNING WITH PAIN IN THE RIGHT SIDE PAIN. WHEN YOU PRESS ON THE ABD IT IS TENDER TO TOUCH. SHE JUST FINISHED LEVAQUIN YESTERDAY. SHE GOT DISCHARGED FROM THE HOSPITAL LAST FRIDAY FOR PNEUMONIA. APPT TOMORROW FOR FOLLOW UP FROM THE DISCHARGE FROM THE HOSPITAL. RIGHT LOWER AND THEN UP TO THE WAIST LINE IS TENDER TO TOUCH. SHE HAD BM YESTERDAY. NO VOMITING, NO DIARRHEA. TEMP OF 98.4 PAIN IN ABD IS CONSTANT. HOLDING RIGHT SIDE. Has the patient traveled out of the country within the last 30 days? ---Not Applicable Does the patient have any new or worsening symptoms? ---Yes Will a triage be completed?  ---Yes Related visit to physician within the last 2 weeks? ---No Does the PT have any chronic conditions? (i.e. diabetes, asthma, etc.) ---Yes List chronic conditions. ---MEMORY ISSUES - PLEASE SEE EPIC Is this a behavioral health or substance abuse call? ---No Guidelines Guideline Title Affirmed Question Affirmed Notes Abdominal Pain - Female [1] SEVERE pain (e.g., excruciating) AND [2] present > 1 hour Final Disposition User Go to ED Now [place], Therapist, sports, [facility] - ED Disagree/Comply: Comply

## 2015-03-06 NOTE — Telephone Encounter (Signed)
PLEASE NOTE: All timestamps contained within this report are represented as [place] Standard Time. CONFIDENTIALTY NOTICE: This fax transmission is intended only for the addressee. It contains information that is legally privileged, confidential or otherwise protected from use or disclosure. If you are not the intended recipient, you are strictly prohibited from reviewing, disclosing, copying using or disseminating any of this information or taking any action in reliance on or regarding this information. If you have received this fax in error, please notify us immediately by telephone so that we can arrange for its return to [place]. Phone: [phone], Toll-Free: [phone], Fax: [phone] Page: 1 of 2 Call Id: [id number] [place] Patient Name: Rhonda Foley Gender: Female DOB: [birth year] Age: 82 Y 33 M 19 D Return Phone Number: [phone] (Primary), [phone] (Secondary) Address: City/State/Zip: [place] Client [facility] - Client Client Site [place], [place] Type Call Call Type Triage / [place] Name [name] Relationship To Patient Other relative Appointment Disposition EMR Appointment Not Necessary Info pasted into Epic Yes Return Phone Number [phone] (Primary) Chief Complaint Abdominal Pain Initial Comment Caller states patient was in the hospital fro pneumonia; was put on a medication and is fine; appetite decreased slightly; pain in her right side; painful to touch; 98.4--Signal is terrible call both numbers PreDisposition Call Doctor Nurse Assessment Nurse: [name], RN, [name] Date/Time [name] Time): 03/06/2015 9:50:52 AM Confirm and document reason for call. If symptomatic, describe symptoms. ---SHE WAS IN THE HOSPITAL FOR PNEUMONIA. SHE GOT UP THIS MORNING WITH PAIN IN THE RIGHT SIDE PAIN.  WHEN YOU PRESS ON THE ABD IT IS TENDER TO TOUCH. SHE JUST FINISHED LEVAQUIN YESTERDAY. SHE GOT DISCHARGED FROM THE HOSPITAL LAST FRIDAY FOR PNEUMONIA. APPT TOMORROW FOR FOLLOW UP FROM THE DISCHARGE FROM THE HOSPITAL. RIGHT LOWER AND THEN UP TO THE WAIST LINE IS TENDER TO TOUCH. SHE HAD BM YESTERDAY. NO VOMITING, NO DIARRHEA. TEMP OF 98.4 PAIN IN ABD IS CONSTANT. HOLDING RIGHT SIDE. Has the patient traveled out of the country within the last 30 days? ---Not Applicable Does the patient have any new or worsening symptoms? ---Yes Will a triage be completed? ---Yes Related visit to physician within the last 2 weeks? ---No Does the PT have any chronic conditions? (i.e. diabetes, asthma, etc.) ---Yes List chronic conditions. ---MEMORY ISSUES - PLEASE SEE EPIC Is this a behavioral health or substance abuse call? ---No PLEASE NOTE: All timestamps contained within this report are represented as [place] Standard Time. CONFIDENTIALTY NOTICE: This fax transmission is intended only for the addressee. It contains information that is legally privileged, confidential or otherwise protected from use or disclosure. If you are not the intended recipient, you are strictly prohibited from reviewing, disclosing, copying using or disseminating any of this information or taking any action in reliance on or regarding this information. If you have received this fax in error, please notify us immediately by telephone so that we can arrange for its return to [place]. Phone: [phone], Toll-Free: [phone], Fax: [phone] Page: 2 of 2 Call Id: [id number] Guidelines Guideline Title Affirmed Question Affirmed Notes Nurse Date/Time [name] Time) Abdominal Pain - Female [1] SEVERE pain (e.g., excruciating) AND [2] present > 1 hour Dwight, RN, [name] 03/06/2015 9:51:42 AM Disp. Time [name] Time) Disposition Final User 03/06/2015 9:53:50 AM Go to ED Now Yes [name], RN, [name] Caller Understands: Yes Disagree/Comply:  Comply Care Advice Given Per  Guideline GO TO ED NOW: You need to be seen in the Emergency Department. Go to the ER at ___________ [place] now. Drive carefully. CARE ADVICE given per Abdominal Pain, Female (Adult) guideline. After Care Instructions Given Call Event Type User Date / Time Description Referrals [facility] - ED

## 2015-03-06 NOTE — ED Provider Notes (Signed)
CSN: [id number]     Arrival date & time 03/06/15  1100 History   First MD Initiated Contact with Patient 03/06/15 1609     Chief Complaint  Patient presents with  . Abdominal Pain     (Consider location/radiation/quality/duration/timing/severity/associated sxs/prior Treatment) HPI Comments: 82 y.o. F with PMH of 2 UGIB in setting of NSAID use in 1990s, HLD, HTN presenting for abdominal pain.  Patient unable to provide history given dementia.  History obtained from daughter-in-law.  She reports that 24/7 caregiver lives in patient's home.  This AM around 730 when she was waking patient up, patient grabbed RLQ and complained of pain.  It is known that patient had BM yesterday and she continues to pass flatus today.  Caregiver tried to walk patient to see if it was gas pains, but this worsened pain.  She has had decreased PO intake over last 2-3 days.  No fevers.  There have been no complaints of hematochezia, melena, N/V, dysuria, hematuria, urinary frequency/urgency, SOB, CP, LE edema.  Patient has never had abdominal surgery.  She has not taken any NSAIDs in last several years.  Patient is a 82 y.o. female presenting with abdominal pain. The history is provided by a relative.  Abdominal Pain Pain location:  RLQ Pain quality comment:  Unable to qualify Pain radiates to:  Does not radiate Pain severity:  Severe Onset quality:  Sudden Duration:  10 hours Timing:  Constant Progression:  Unchanged Chronicity:  New Relieved by:  None tried Worsened by:  Movement Ineffective treatments:  None tried Associated symptoms: flatus   Associated symptoms: no chest pain, no constipation, no diarrhea, no dysuria, no fatigue, no fever, no hematemesis, no hematochezia, no melena, no nausea, no shortness of breath and no vomiting   Risk factors: has not had multiple surgeries and no NSAID use     Past Medical History  Diagnosis Date  . HLD (hyperlipidemia) 1991    "not on RX for years" (02/24/2015)  .  HTN (hypertension) 1997  . History of UTI     "infrequent"  . Cardiomegaly 04/2013    by CXR  . Thoracic compression fracture ([facility]) 04/2013    by CXR  . COPD (chronic obstructive pulmonary disease) ([facility]) 04/2013    by CXR  . Osteopenia 04/2013    by CXR  . Shoulder fracture, right 01/2008    "fell off the porch; put her in a brace; no OR; Dr. Reche Dixon"  . CAP (community acquired pneumonia) 02/24/2015  . Bleeding stomach ulcer 1993; 12/99  . Osteoarthritis   . Dementia 06/2011    MMSE 14/30, thought senile although fmhx alz type; did not respond to aricept trial  . Short-term memory loss    Past Surgical History  Procedure Laterality Date  . Ankle fracture surgery Right 1993  . Sigmoidoscopy  06/21/96  . US echocardiography  04/2014    ER 55-60%, (mod MR, mod TR, mod elevated PA pressures  . Fracture surgery    . Cataract extraction w/ intraocular lens  implant, bilateral Bilateral    Family History  Problem Relation Age of Onset  . Other Father     stomach problem; terminal stomach pain  . Other Father     "lost his mind"  . Alzheimer's disease Mother   . Cancer Brother   . Cancer Sister   . Dementia Sister   . Hypertension Brother   . Hypertension Sister    Social History  Substance Use Topics  .  Smoking status: Never Smoker   . Smokeless tobacco: Never Used  . Alcohol Use: No   OB History    No data available     Review of Systems  Constitutional: Negative for fever and fatigue.  HENT: Positive for congestion.   Respiratory: Negative for shortness of breath.   Cardiovascular: Negative for chest pain.  Gastrointestinal: Positive for abdominal pain and flatus. Negative for nausea, vomiting, diarrhea, constipation, melena, hematochezia and hematemesis.  Genitourinary: Negative for dysuria.  All other systems reviewed and are negative.     Allergies  Nsaids  Home Medications   Prior to Admission medications   Medication Sig Start Date End Date Taking?  Authorizing Provider  acetaminophen (TYLENOL) 500 MG tablet Take 1,000 mg by mouth 2 (two) times daily as needed for fever (pain).    Yes Historical Provider, MD  albuterol (PROVENTIL HFA;VENTOLIN HFA) 108 (90 Base) MCG/ACT inhaler Inhale 2 puffs into the lungs every 6 (six) hours as needed for wheezing or shortness of breath. 02/28/15  Yes Debbe Odea, MD  guaiFENesin (MUCINEX) 600 MG 12 hr tablet Take 1 tablet (600 mg total) by mouth 2 (two) times daily. 02/28/15  Yes Debbe Odea, MD  hydrochlorothiazide (MICROZIDE) 12.5 MG capsule TAKE ONE CAPSULE BY MOUTH DAILY 10/21/14  Yes [name], MD  loratadine (CLARITIN) 10 MG tablet Take 10 mg by mouth daily.   Yes Historical Provider, MD  Multiple Vitamin (MULTIVITAMIN WITH MINERALS) TABS tablet Take 1 tablet by mouth daily.   Yes Historical Provider, MD  levofloxacin (LEVAQUIN) 750 MG tablet Take 1 tablet (750 mg total) by mouth daily. Patient not taking: Reported on 03/06/2015 02/28/15   Debbe Odea, MD   BP 159/70 mmHg  Pulse 81  Temp(Src) 98 F (36.7 C) (Oral)  Resp 14  SpO2 97% Physical Exam  Constitutional: She appears well-developed and well-nourished. No distress.  Does not answer many questions, but pleasant  HENT:  Head: Normocephalic.  Right Ear: External ear normal.  Left Ear: External ear normal.  Mouth/Throat: Oropharynx is clear and moist. No oropharyngeal exudate.  Bruising to L cheek  Eyes: Conjunctivae and EOM are normal. Pupils are equal, round, and reactive to light. No scleral icterus.  Neck: Neck supple. No thyromegaly present.  Cardiovascular: Normal rate, regular rhythm, normal heart sounds and intact distal pulses.   No murmur heard. Pulmonary/Chest: Effort normal.  Coarse breath sounds in bilateral bases  Abdominal: Soft. Bowel sounds are normal. She exhibits no distension and no mass. There is no rebound.  TTP with guarding in RUQ, right mid, and occasionally RLQ  Musculoskeletal: She exhibits no edema  or tenderness.  Lymphadenopathy:    She has no cervical adenopathy.  Neurological: She is alert.  Daughter reports this is mental status baseline  Skin: Skin is warm and dry. No rash noted.    ED Course  Procedures (including critical care time) Labs Review Labs Reviewed  COMPREHENSIVE METABOLIC PANEL - Abnormal; Notable for the following:    Glucose, Bld 104 (*)    Albumin 2.9 (*)    All other components within normal limits  CBC - Abnormal; Notable for the following:    WBC 12.0 (*)    Platelets 505 (*)    All other components within normal limits  URINALYSIS, ROUTINE W REFLEX MICROSCOPIC (NOT AT [facility]) - Abnormal; Notable for the following:    Specific Gravity, Urine 1.031 (*)    All other components within normal limits  LIPASE, BLOOD    Imaging  Review Ct Abdomen Pelvis W Contrast  03/06/2015  CLINICAL DATA:  Right lower quadrant pain. Evaluate for appendicitis. EXAM: CT ABDOMEN AND PELVIS WITH CONTRAST TECHNIQUE: Multidetector CT imaging of the abdomen and pelvis was performed using the standard protocol following bolus administration of intravenous contrast. CONTRAST:  58mL OMNIPAQUE IOHEXOL 300 MG/ML  SOLN COMPARISON:  None. FINDINGS: Normal hepatic contour. Note is made of an approximately 2.8 cm hypo attenuating (7 Hounsfield unit) cyst within the medial segment of the left lobe of the liver. Additional scattered punctate sub cm hepatic lesions are too small to accurately characterize though favored to represent additional hepatic cysts. Normal appearance of the gallbladder given degree distention. No radiopaque gallstones. No intra extrahepatic biliary duct dilatation. No ascites. There is symmetric enhancement and excretion the kidneys. Scattered bilateral sub cm hypoattenuating renal lesions are too small to accurately characterize though favored to represent renal cysts. Cortical calcifications are noted about medial aspect the right kidney (coronal image 78, series 5). No  definite evidence of nephrolithiasis. No renal stones are seen along the expected course of either ureter or the urinary bladder. Normal appearance of the urinary bladder given degree distention. No urinary obstruction a perinephric stranding. Normal appearance of the bilateral adrenal glands, pancreas and spleen. Note is made of a approximately 4.1 x 3.4 cm duodenal diverticulum. Scattered colonic diverticulosis without evidence of diverticulitis. Moderate to large colonic stool burden without evidence of enteric obstruction. Normal appearance terminal ileum and retrocecal appendix. No pneumoperitoneum, pneumatosis or portal venous gas. Scattered atherosclerotic plaque within a normal caliber abdominal aorta. The major branch vessels of the abdominal aorta appear widely patent on this non CTA examination. No bulky retroperitoneal, mesenteric, pelvic or inguinal lymphadenopathy. There is reflux of contrast into a mildly hypertrophied left gonadal vein weights supplies several mildly hypertrophied left adnexal venous collaterals. Otherwise, normal appearance of the pelvic organs for age. No free fluid in the pelvic cul-de-sac. Limited visualization of the lower thorax demonstrates minimal subsegmental atelectasis within the imaged caudal aspects of the right middle lobe and lingula with potential minimal bronchiectasis (representative images 7 and 8, series 3). These findings are associated with ill-defined nodules scattered within a perilymphatic distribution within the image right lower lobe with dominant nodule with the right lower lobe measuring 4 mm in diameter (image 5, series 3). There is an additional less well-defined approximately 4 mm nodule within the left costophrenic angle (image 1, series 3). No pleural effusion or pneumothorax. Cardiomegaly. Calcifications about the aortic valve annulus. No pericardial effusion. No or aggressive osseous. There is minimal (approximately 3 mm) of anterolisthesis of L4  upon L5, likely degenerative in etiology. Intraosseous hemangiomas are noted within the L1 and L2 vertebral bodies without associated vertebral body collapse. Tiny mesenteric fat containing periumbilical hernia. IMPRESSION: 1. No definite explanation for patient's right lower quadrant abdominal pain. Specifically, no evidence enteric or urinary obstruction. Normal appearance of the terminal ileum and retrocecal appendix. 2. Colonic diverticulosis without evidence of diverticulitis. 3. Moderate to large colonic stool burden without evidence of enteric obstruction. 4. Cardiomegaly. 5. Scattered punctate (sub 4 mm) nodules within the imaged bilateral lower lobes which in the absence of prior examination are age indeterminate though given subsegmental atelectasis/collapse and suspected bronchiectasis within the right middle lobe and lingula, these nodules are favored to be the sequela of chronic atypical infection (such as MAI). Clinical correlation is advised. Electronically Signed   By: Sandi Mariscal M.D.   On: 03/06/2015 18:12   I have personally reviewed  and evaluated these images and lab results as part of my medical decision-making.   EKG Interpretation None      MDM   Final diagnoses:  Right lower quadrant abdominal pain  Constipation, unspecified constipation type    82 y/o F with dementia presenting for R sided abd pain.  Patient with history of 2 previous upper GI bleeds 2/2 NSAID use.  Patient currently with VSS and afebrile.  WBC is elevated to 12, labs and UA otherwise unremarkable.  Will obtain CT abd/pelvis with contrast.  CT shows diverticulosis without diverticulitis, normal gallbladder, normal appendix, moderate to large stool burden without evidence of obstruction.  Suspect constipation is playing role in pain.  Explained results to daughter-in-law who agrees to plan to discharge home with slowly advancing diet and colace for stool softener.  Has f/u scheduled with PCP tomorrow.   Return precautions and obstruction signs discussed with family.  Also discussed nodules in lung bases.  Daughter-in-law reports they are aware of these after recent admission for pneumonia and PCP is following.    Virginia Crews, MD 03/06/15 1845  Ripley Fraise, MD 03/07/15 [phone]

## 2015-03-07 ENCOUNTER — Ambulatory Visit (INDEPENDENT_AMBULATORY_CARE_PROVIDER_SITE_OTHER): Payer: PPO | Admitting: Family Medicine

## 2015-03-07 ENCOUNTER — Encounter: Payer: Self-pay | Admitting: Family Medicine

## 2015-03-07 VITALS — BP 124/78 | HR 90 | Temp 98.2°F | Wt 134.5 lb

## 2015-03-07 DIAGNOSIS — J189 Pneumonia, unspecified organism: Secondary | ICD-10-CM | POA: Diagnosis not present

## 2015-03-07 DIAGNOSIS — F0391 Unspecified dementia with behavioral disturbance: Secondary | ICD-10-CM

## 2015-03-07 DIAGNOSIS — F03918 Unspecified dementia, unspecified severity, with other behavioral disturbance: Secondary | ICD-10-CM

## 2015-03-07 DIAGNOSIS — R918 Other nonspecific abnormal finding of lung field: Secondary | ICD-10-CM

## 2015-03-07 DIAGNOSIS — Z7189 Other specified counseling: Secondary | ICD-10-CM

## 2015-03-07 DIAGNOSIS — J9601 Acute respiratory failure with hypoxia: Secondary | ICD-10-CM

## 2015-03-07 DIAGNOSIS — R109 Unspecified abdominal pain: Secondary | ICD-10-CM

## 2015-03-07 MED ORDER — POLYETHYLENE GLYCOL 3350 17 GM/SCOOP PO POWD: 4.0000 g | Freq: Every day | ORAL | Status: DC | PRN

## 2015-03-07 NOTE — Progress Notes (Signed)
Pre visit review using our clinic review tool, if applicable. No additional management support is needed unless otherwise documented below in the visit note. 

## 2015-03-07 NOTE — Assessment & Plan Note (Addendum)
Has completed levaquin course. Slowed recovery. rec rpt CXR in 2 wks - I have asked them to return for this.

## 2015-03-07 NOTE — Assessment & Plan Note (Addendum)
Concern for chronic atypical infection on recent CT (nodules with bronchiectasis) and pt unable to produce sputum for culture. Given extensive regimen for treatment, will discuss pros/cons of treatment of atypical infectionn with daughter in law next visit. RTC 2 wks recheck CXR and further discussion.

## 2015-03-07 NOTE — Progress Notes (Addendum)
BP 124/78 mmHg  Pulse 90  Temp(Src) 98.2 F (36.8 C) (Oral)  Wt 134 lb 8 oz (61.009 kg)  SpO2 96%   CC: hosp f/u visit  Subjective:    Patient ID: Rhonda Foley, female    DOB: December 05, 1916, 82 y.o.   MRN: [id number]  HPI: Rhonda Foley is a 82 y.o. female presenting on 03/07/2015 for Follow-up   Presents with DIL.  Recent hospitalization at [facility] with cough with hypoxia, CXR showed RLL infiltrate. Treated initially with rocephin/zithromax but she worsened so this was transitioned to oral levaquin. She also received nebulizations for chest congestion and sent home with PRN albuterol inhaler. She received risperdal as needed for sundowning while in the hospital.  Seen yesterday at [facility] for right abdominal pain with stable CT scan. ?constipation related - sent home with docusate and full liquid diet. CT did show however multiple pulmonary nodules with bronchiectasis suspicious for chronic MAI infection. She does have a longstanding cough. Significant trouble producing sputum.   Appetite down. Weight dropping. Drank some juice and tomato soup yesterday and today but not much else. Some jello. Last BM Wednesday, unsure since. Passing gas some.   Now breathing better without fever, but still mild congestion and cough. Noticing more instability, slowed movements. Progressive decline noted since fall at home (slipped on rug) and suffered facial contusion/hematoma 02/16/2015.  Has caregiver 24/7. No rashes.   Admit date: 02/24/2015 Discharge date: 02/28/2015 F/u phone call: 03/04/2015 (long weekend)  Recommendations for Outpatient Follow-up:  1. Repeat CXR in 2 wks  Discharge Condition: stable  Discharge Diagnoses:  Principal Problem:  Acute respiratory failure with hypoxia (HCC) Active Problems:  Essential hypertension  Senile dementia  Cardiomegaly  CAP (community acquired pneumonia)  Lung nodule, solitary  Relevant past medical, surgical, family and social history  reviewed and updated as indicated. Interim medical history since our last visit reviewed. Allergies and medications reviewed and updated. Current Outpatient Prescriptions on File Prior to Visit  Medication Sig  . acetaminophen (TYLENOL) 500 MG tablet Take 1,000 mg by mouth 2 (two) times daily as needed for fever (pain).   Marland Kitchen albuterol (PROVENTIL HFA;VENTOLIN HFA) 108 (90 Base) MCG/ACT inhaler Inhale 2 puffs into the lungs every 6 (six) hours as needed for wheezing or shortness of breath.  . guaiFENesin (MUCINEX) 600 MG 12 hr tablet Take 1 tablet (600 mg total) by mouth 2 (two) times daily.  . hydrochlorothiazide (MICROZIDE) 12.5 MG capsule TAKE ONE CAPSULE BY MOUTH DAILY  . loratadine (CLARITIN) 10 MG tablet Take 10 mg by mouth daily.  . Multiple Vitamin (MULTIVITAMIN WITH MINERALS) TABS tablet Take 1 tablet by mouth daily.   No current facility-administered medications on file prior to visit.    Review of Systems Per HPI unless specifically indicated in ROS section     Objective:    BP 124/78 mmHg  Pulse 90  Temp(Src) 98.2 F (36.8 C) (Oral)  Wt 134 lb 8 oz (61.009 kg)  SpO2 96%  Wt Readings from Last 3 Encounters:  03/07/15 134 lb 8 oz (61.009 kg)  02/25/15 136 lb 14.5 oz (62.1 kg)  02/20/15 140 lb 8 oz (63.73 kg)    Physical Exam  Constitutional: She appears well-developed and well-nourished. No distress.  Tired appearing, easily falls asleep in office   HENT:  Mouth/Throat: Oropharynx is clear and moist. No oropharyngeal exudate.  Cardiovascular: Normal rate, regular rhythm, normal heart sounds and intact distal pulses.   No murmur heard. Pulmonary/Chest: Effort  normal and breath sounds normal. No respiratory distress. She has no wheezes. She has no rales.  Coarse throughout  Abdominal: Soft. Normal appearance and bowel sounds are normal. She exhibits no distension and no mass. There is no hepatosplenomegaly. There is tenderness (mild right sided). There is no rigidity, no  rebound, no guarding, no CVA tenderness and negative Murphy's sign.  Musculoskeletal: She exhibits no edema.  Nursing note and vitals reviewed.  Results for orders placed or performed during the hospital encounter of 03/06/15  Lipase, blood  Result Value Ref Range   Lipase 25 11 - 51 U/L  Comprehensive metabolic panel  Result Value Ref Range   Sodium 139 135 - 145 mmol/L   Potassium 4.0 3.5 - 5.1 mmol/L   Chloride 104 101 - 111 mmol/L   CO2 25 22 - 32 mmol/L   Glucose, Bld 104 (H) 65 - 99 mg/dL   BUN 19 6 - 20 mg/dL   Creatinine, Ser 0.70 0.44 - 1.00 mg/dL   Calcium 9.4 8.9 - 10.3 mg/dL   Total Protein 6.5 6.5 - 8.1 g/dL   Albumin 2.9 (L) 3.5 - 5.0 g/dL   AST 23 15 - 41 U/L   ALT 21 14 - 54 U/L   Alkaline Phosphatase 70 38 - 126 U/L   Total Bilirubin 0.5 0.3 - 1.2 mg/dL   GFR calc non Af Amer >60 >60 mL/min   GFR calc Af Amer >60 >60 mL/min   Anion gap 10 5 - 15  CBC  Result Value Ref Range   WBC 12.0 (H) 4.0 - 10.5 K/uL   RBC 4.20 3.87 - 5.11 MIL/uL   Hemoglobin 12.6 12.0 - 15.0 g/dL   HCT 38.3 36.0 - 46.0 %   MCV 91.2 78.0 - 100.0 fL   MCH 30.0 26.0 - 34.0 pg   MCHC 32.9 30.0 - 36.0 g/dL   RDW 13.6 11.5 - 15.5 %   Platelets 505 (H) 150 - 400 K/uL  Urinalysis, Routine w reflex microscopic (not at [facility])  Result Value Ref Range   Color, Urine YELLOW YELLOW   APPearance CLEAR CLEAR   Specific Gravity, Urine 1.031 (H) 1.005 - 1.030   pH 5.5 5.0 - 8.0   Glucose, UA NEGATIVE NEGATIVE mg/dL   Hgb urine dipstick NEGATIVE NEGATIVE   Bilirubin Urine NEGATIVE NEGATIVE   Ketones, ur NEGATIVE NEGATIVE mg/dL   Protein, ur NEGATIVE NEGATIVE mg/dL   Nitrite NEGATIVE NEGATIVE   Leukocytes, UA NEGATIVE NEGATIVE   CT ABDOMEN AND PELVIS WITH CONTRAST TECHNIQUE: Multidetector CT imaging of the abdomen and pelvis was performed using the standard protocol following bolus administration of intravenous contrast. CONTRAST: 79mL OMNIPAQUE IOHEXOL 300 MG/ML SOLN COMPARISON:  None. FINDINGS: Normal hepatic contour. Note is made of an approximately 2.8 cm hypo attenuating (7 Hounsfield unit) cyst within the medial segment of the left lobe of the liver. Additional scattered punctate sub cm hepatic lesions are too small to accurately characterize though favored to represent additional hepatic cysts. Normal appearance of the gallbladder given degree distention. No radiopaque gallstones. No intra extrahepatic biliary duct dilatation. No ascites.  There is symmetric enhancement and excretion the kidneys. Scattered bilateral sub cm hypoattenuating renal lesions are too small to accurately characterize though favored to represent renal cysts. Cortical calcifications are noted about medial aspect the right kidney (coronal image 78, series 5). No definite evidence of nephrolithiasis. No renal stones are seen along the expected Foley of either ureter or the urinary bladder. Normal appearance of  the urinary bladder given degree distention. No urinary obstruction a perinephric stranding. Normal appearance of the bilateral adrenal glands, pancreas and spleen.  Note is made of a approximately 4.1 x 3.4 cm duodenal diverticulum. Scattered colonic diverticulosis without evidence of diverticulitis. Moderate to large colonic stool burden without evidence of enteric obstruction. Normal appearance terminal ileum and retrocecal appendix. No pneumoperitoneum, pneumatosis or portal venous gas.  Scattered atherosclerotic plaque within a normal caliber abdominal aorta. The major branch vessels of the abdominal aorta appear widely patent on this non CTA examination.  No bulky retroperitoneal, mesenteric, pelvic or inguinal lymphadenopathy.  There is reflux of contrast into a mildly hypertrophied left gonadal vein weights supplies several mildly hypertrophied left adnexal venous collaterals. Otherwise, normal appearance of the pelvic organs for age. No free fluid in the pelvic  cul-de-sac.  Limited visualization of the lower thorax demonstrates minimal subsegmental atelectasis within the imaged caudal aspects of the right middle lobe and lingula with potential minimal bronchiectasis (representative images 7 and 8, series 3). These findings are associated with ill-defined nodules scattered within a perilymphatic distribution within the image right lower lobe with dominant nodule with the right lower lobe measuring 4 mm in diameter (image 5, series 3). There is an additional less well-defined approximately 4 mm nodule within the left costophrenic angle (image 1, series 3). No pleural effusion or pneumothorax.  Cardiomegaly. Calcifications about the aortic valve annulus. No pericardial effusion.  No or aggressive osseous. There is minimal (approximately 3 mm) of anterolisthesis of L4 upon L5, likely degenerative in etiology. Intraosseous hemangiomas are noted within the L1 and L2 vertebral bodies without associated vertebral body collapse.  Tiny mesenteric fat containing periumbilical hernia.  IMPRESSION: 1. No definite explanation for patient's right lower quadrant abdominal pain. Specifically, no evidence enteric or urinary obstruction. Normal appearance of the terminal ileum and retrocecal appendix. 2. Colonic diverticulosis without evidence of diverticulitis. 3. Moderate to large colonic stool burden without evidence of enteric obstruction. 4. Cardiomegaly. 5. Scattered punctate (sub 4 mm) nodules within the imaged bilateral lower lobes which in the absence of prior examination are age indeterminate though given subsegmental atelectasis/collapse and suspected bronchiectasis within the right middle lobe and lingula, these nodules are favored to be the sequela of chronic atypical infection (such as MAI). Clinical correlation is advised.  Electronically Signed  By: Sandi Mariscal M.D.  On: 03/06/2015 18:12    Assessment & Plan:   Problem List Items Addressed  This Visit    Senile dementia    DIL has noticed progression of dementia and generalized decline that started since fall with facial fracture, compounded by recent CAP. Now has 24/7 assistance at home.  Discussed hospice, DIL would like to defer referral for now. She is planning on having meeting with all of patient's sons to review advanced directive planning and plan of care.       Right sided abdominal pain    Reviewed recent ER evaluation - ?constipation related with unrevealing labwork and CT abd/pelvis. rec start 1/4 capful miralax daily, continue liquid diet and colace, discussed red flags to seek urgent care (fever, worsening abd pain, obstipation, nausea/vomiting. DIL agrees with plan.      CAP (community acquired pneumonia) - Primary    Has completed levaquin Foley. Slowed recovery. rec rpt CXR in 2 wks - I have asked them to return for this.      Advanced care planning/counseling discussion    Progressive decline noted over last few weeks involving fall then pneumonia. Living  will at home (would want sons to be HCPOA). Daughter-in-law planning on having family meeting with all of patient's sons to discuss advanced directives. We briefly discussed hospice/palliative care involvement, DIL would like to defer for now.       RESOLVED: Acute respiratory failure with hypoxia (HCC)    This has resolved.      Abnormal CT scan, lung    Concern for chronic atypical infection on recent CT (nodules with bronchiectasis) and pt unable to produce sputum for culture. Given extensive regimen for treatment, will discuss pros/cons of treatment of atypical infectionn with daughter in law next visit. RTC 2 wks recheck CXR and further discussion.          Follow up plan: Return in about 2 weeks (around 03/21/2015), or as needed, for follow up visit.

## 2015-03-07 NOTE — Patient Instructions (Addendum)
Continue colace and liquid diet.  Try 1/4 capful of miralax once daily as long as no signs of bowel obstruction (vomiting, worsening pain, stop passing gas or making stool).  We will call you next week with plan. Return to see me in 2 weeks for follow up and repeat xray. Good to see you today!

## 2015-03-07 NOTE — Assessment & Plan Note (Signed)
This has resolved.

## 2015-03-07 NOTE — Assessment & Plan Note (Signed)
DIL has noticed progression of dementia and generalized decline that started since fall with facial fracture, compounded by recent CAP. Now has 24/7 assistance at home.  Discussed hospice, DIL would like to defer referral for now. She is planning on having meeting with all of patient's sons to review advanced directive planning and plan of care.

## 2015-03-10 ENCOUNTER — Telehealth: Payer: Self-pay

## 2015-03-10 DIAGNOSIS — Z7189 Other specified counseling: Secondary | ICD-10-CM | POA: Insufficient documentation

## 2015-03-10 DIAGNOSIS — R109 Unspecified abdominal pain: Secondary | ICD-10-CM | POA: Insufficient documentation

## 2015-03-10 NOTE — Assessment & Plan Note (Addendum)
Progressive decline noted over last few weeks involving fall then pneumonia. Living will at home (would want sons to be HCPOA). Daughter-in-law planning on having family meeting with all of patient's sons to discuss advanced directives. We briefly discussed hospice/palliative care involvement, DIL would like to defer for now.

## 2015-03-10 NOTE — Assessment & Plan Note (Addendum)
Reviewed recent ER evaluation - ?constipation related with unrevealing labwork and CT abd/pelvis. rec start 1/4 capful miralax daily, continue liquid diet and colace, discussed red flags to seek urgent care (fever, worsening abd pain, obstipation, nausea/vomiting. DIL agrees with plan.

## 2015-03-10 NOTE — Telephone Encounter (Signed)
[name] left v/m with update; pt seen 03/07/15 and today pt is doing OK; pt had BM 03/09/15; no fevers, seems to sleep little more but other than that pt is doing better. FYI to Dr Danise Mina. Will keep appt 03/24/15.

## 2015-03-10 NOTE — Telephone Encounter (Signed)
Spoke with DIL. Will further discuss possible chronic atypical infection at f/u visit.

## 2015-03-24 ENCOUNTER — Encounter: Payer: Self-pay | Admitting: Family Medicine

## 2015-03-24 ENCOUNTER — Ambulatory Visit (INDEPENDENT_AMBULATORY_CARE_PROVIDER_SITE_OTHER)
Admission: RE | Admit: 2015-03-24 | Discharge: 2015-03-24 | Disposition: A | Discharge status: Home / Self Care | Payer: PPO | Source: Ambulatory Visit | Attending: Family Medicine | Admitting: Family Medicine

## 2015-03-24 ENCOUNTER — Ambulatory Visit (INDEPENDENT_AMBULATORY_CARE_PROVIDER_SITE_OTHER): Payer: PPO | Admitting: Family Medicine

## 2015-03-24 VITALS — BP 108/72 | HR 76 | Temp 97.7°F | Wt 134.2 lb

## 2015-03-24 DIAGNOSIS — R918 Other nonspecific abnormal finding of lung field: Secondary | ICD-10-CM

## 2015-03-24 DIAGNOSIS — R109 Unspecified abdominal pain: Secondary | ICD-10-CM | POA: Diagnosis not present

## 2015-03-24 DIAGNOSIS — J189 Pneumonia, unspecified organism: Secondary | ICD-10-CM

## 2015-03-24 NOTE — Progress Notes (Signed)
BP 108/72 mmHg  Pulse 76  Temp(Src) 97.7 F (36.5 C) (Oral)  Wt 134 lb 4 oz (60.895 kg)  SpO2 98%   CC: f/u PNA  Subjective:    Patient ID: Rhonda Foley, female    DOB: [birth year], 82 y.o.   MRN: [id number]  HPI: Rhonda Foley is a 82 y.o. female presenting on 03/24/2015 for Follow-up   See prior note for details. Here with DIL today. Pt lives at home alone but has caregiver 24/7 and son/DIL are neighbors. Concern for progression of dementia and generalized decline over last several months. Family wants to keep her at home as much as able.  DIL had meeting with all of patient's sons to review advanced directive planning. Family seems to agree to DNR. Pt's dementia makes incapable of informed consent.   Current bowel regimen - 1 teaspoon miralax in am, prune juice and colace at night. Working well.  Concern for chronic atypical infection on recent CT abd/pelvis (nodules with bronchiectasis) and pt unable to produce sputum for culture. Given extensive regimen for treatment, we discussed pros/cons of treatment of atypical infection - see below.  Relevant past medical, surgical, family and social history reviewed and updated as indicated. Interim medical history since our last visit reviewed. Allergies and medications reviewed and updated. Current Outpatient Prescriptions on File Prior to Visit  Medication Sig  . acetaminophen (TYLENOL) 500 MG tablet Take 1,000 mg by mouth 2 (two) times daily as needed for fever (pain).   . hydrochlorothiazide (MICROZIDE) 12.5 MG capsule TAKE ONE CAPSULE BY MOUTH DAILY  . loratadine (CLARITIN) 10 MG tablet Take 10 mg by mouth daily.  . Multiple Vitamin (MULTIVITAMIN WITH MINERALS) TABS tablet Take 1 tablet by mouth daily.  . polyethylene glycol powder (GLYCOLAX/MIRALAX) powder Take 4 g by mouth daily as needed for moderate constipation.   No current facility-administered medications on file prior to visit.    Review of Systems Per HPI unless  specifically indicated in ROS section     Objective:    BP 108/72 mmHg  Pulse 76  Temp(Src) 97.7 F (36.5 C) (Oral)  Wt 134 lb 4 oz (60.895 kg)  SpO2 98%  Wt Readings from Last 3 Encounters:  03/24/15 134 lb 4 oz (60.895 kg)  03/07/15 134 lb 8 oz (61.009 kg)  02/25/15 136 lb 14.5 oz (62.1 kg)    Physical Exam  Constitutional: She appears well-developed and well-nourished. No distress.  pleasant  HENT:  Mouth/Throat: Oropharynx is clear and moist. No oropharyngeal exudate.  Cardiovascular: Normal rate, regular rhythm, normal heart sounds and intact distal pulses.   No murmur heard. Pulmonary/Chest: Effort normal and breath sounds normal. No respiratory distress. She has no wheezes. She has no rales.  Coarse througout   Musculoskeletal: She exhibits no edema.  Nursing note and vitals reviewed.     Assessment & Plan:   Problem List Items Addressed This Visit    Right sided abdominal pain    This has resolved. Thought constipation related. Now moving bowels well.      CAP (community acquired pneumonia) - Primary    Continues slowly improving. Update CXR today.       Relevant Orders   DG Chest 2 View (Completed)   Abnormal CT scan, lung    Reviewed with DIL concern for atypical infection like MAI on limited CT - unable to expectorate sputum and rec against bronchial lavage - and given mild sxs and extensive treatment regimen (3 drug regimen for  months to years that carries its own adverse effects) we decided to just monitor symptoms for now. If worsening clinically, consider more aggressive eval/treatment          Follow up plan: Return in about 5 months (around 08/22/2015), or as needed, for follow up visit.

## 2015-03-24 NOTE — Patient Instructions (Addendum)
Xray today Return in 5 months for follow up.

## 2015-03-24 NOTE — Progress Notes (Signed)
Pre visit review using our clinic review tool, if applicable. No additional management support is needed unless otherwise documented below in the visit note. 

## 2015-03-26 ENCOUNTER — Encounter: Payer: Self-pay | Admitting: Family Medicine

## 2015-03-26 NOTE — Assessment & Plan Note (Addendum)
Reviewed with DIL concern for atypical infection like MAI on limited CT - unable to expectorate sputum and rec against bronchial lavage - and given mild sxs and extensive treatment regimen (3 drug regimen for months to years that carries its own adverse effects) we decided to just monitor symptoms for now. If worsening clinically, consider more aggressive eval/treatment

## 2015-03-26 NOTE — Assessment & Plan Note (Signed)
This has resolved. Thought constipation related. Now moving bowels well.

## 2015-03-26 NOTE — Assessment & Plan Note (Signed)
Continues slowly improving. Update CXR today.

## 2015-04-18 ENCOUNTER — Telehealth: Payer: Self-pay

## 2015-04-18 NOTE — Telephone Encounter (Signed)
Pt daughter called and received automated call re: scheduling medicare well visit. Is this necessary? Pt is 89, she does not get colonoscopys, mammograms, and has all advance directives in place. Pt daugher, [name], would like a call back to answer more questions. She has a follow up with Dr. Danise Mina in June 2017. Please advise. Thanks   [phone]

## 2015-04-18 NOTE — Telephone Encounter (Signed)
Attempted to reach daughter to discuss concerns. Left voicemail msg with contact info.

## 2015-04-20 ENCOUNTER — Other Ambulatory Visit: Payer: Self-pay | Admitting: Family Medicine

## 2015-04-21 NOTE — Telephone Encounter (Signed)
Follow up scheduled in June 2017.

## 2015-08-05 ENCOUNTER — Encounter: Payer: Self-pay | Admitting: Family Medicine

## 2015-08-05 ENCOUNTER — Ambulatory Visit (INDEPENDENT_AMBULATORY_CARE_PROVIDER_SITE_OTHER): Payer: PPO | Admitting: Family Medicine

## 2015-08-05 VITALS — BP 102/58 | HR 86 | Temp 98.3°F | Wt 141.5 lb

## 2015-08-05 DIAGNOSIS — R05 Cough: Secondary | ICD-10-CM

## 2015-08-05 DIAGNOSIS — R059 Cough, unspecified: Secondary | ICD-10-CM

## 2015-08-05 MED ORDER — AMOXICILLIN-POT CLAVULANATE 875-125 MG PO TABS: 1.0000 | ORAL_TABLET | Freq: Two times a day (BID) | ORAL | Status: DC

## 2015-08-05 NOTE — Patient Instructions (Signed)
Start the antibiotics and update either me or Dr. Darnell Level in about 2 days.   Take care.  Glad to see you.

## 2015-08-05 NOTE — Progress Notes (Signed)
Pre visit review using our clinic review tool, if applicable. No additional management support is needed unless otherwise documented below in the visit note.  Here with family.  Hx per family, some per patient.  Denies pain.  Cough started about 5 days ago, more today.  H/o PNA back in 01/2015.  Sounds like she needs to produce sputum but can't get it up before she swallows it.  Has 24/7 care from memory loss.  No fevers.  No inc in wob known.  She has some SOB at baseline.  Weight is up- she has no sense of fullness with eating.  Limited exercise.    Meds, vitals, and allergies reviewed.   ROS: Per HPI unless specifically indicated in ROS section   GEN: nad, alert and pleasant but demented at baseline HEENT: mucous membranes moist, tm w/o erythema, nasal exam w/o erythema, clear discharge noted,  OP with cobblestoning NECK: supple w/o LA CV: rrr.   PULM: ctab except for rhonchi noted in the RLL, no inc wob EXT: no edema SKIN: no acute rash

## 2015-08-06 NOTE — Assessment & Plan Note (Signed)
Given the exam and recent/distant hx, would treat.  Start augmentin, continue mucinex and baseline meds.  Family to up date me or pcp in about 2 days.   Nontoxic. Still okay for outpatient f/u.   At this point cxr wouldn't likely change mgmt, d/w family and all in agreement.

## 2015-08-07 ENCOUNTER — Encounter: Payer: Self-pay | Admitting: Family Medicine

## 2015-08-07 ENCOUNTER — Ambulatory Visit (INDEPENDENT_AMBULATORY_CARE_PROVIDER_SITE_OTHER)
Admission: RE | Admit: 2015-08-07 | Discharge: 2015-08-07 | Disposition: A | Discharge status: Home / Self Care | Payer: PPO | Source: Ambulatory Visit | Attending: Family Medicine | Admitting: Family Medicine

## 2015-08-07 ENCOUNTER — Ambulatory Visit (INDEPENDENT_AMBULATORY_CARE_PROVIDER_SITE_OTHER): Payer: PPO | Admitting: Family Medicine

## 2015-08-07 ENCOUNTER — Telehealth: Payer: Self-pay

## 2015-08-07 VITALS — BP 124/74 | HR 73 | Temp 97.6°F | Wt 141.2 lb

## 2015-08-07 DIAGNOSIS — R05 Cough: Secondary | ICD-10-CM

## 2015-08-07 DIAGNOSIS — R059 Cough, unspecified: Secondary | ICD-10-CM

## 2015-08-07 MED ORDER — CEFTRIAXONE SODIUM 1 G IJ SOLR
1.0000 g | Freq: Once | INTRAMUSCULAR | Status: AC
  Administered 2015-08-07: 1 g via INTRAMUSCULAR

## 2015-08-07 NOTE — Addendum Note (Signed)
Addended by: Tammi Sou on: 08/07/2015 04:53 PM   Modules accepted: Orders

## 2015-08-07 NOTE — Patient Instructions (Signed)
Xray overall ok.  Ceftriaxone shot today.  Continue mucinex ER 600mg  twice daily with a big glass of water.  Watch for fever, worsening cough or shortness of breath. Update [place] with how Rhonda Foley doing next week.

## 2015-08-07 NOTE — Telephone Encounter (Signed)
[name] left v/m(DPR signed); pt was seen 08/05/15 with cough; today pts cough is deeper and breathing more labored. [name] request cb. I spoke with [name] and she request appt for pt to be seen today. Scheduled appt with Dr Darnell Level 08/07/15 at 3:45. [name] said if pt condition changes or worsens prior to appt she will call [facility] but [name] thought 3:45 appt was OK.

## 2015-08-07 NOTE — Telephone Encounter (Signed)
Noted. Will see then. Will order CXR as well so it can be done right when patient arrives.

## 2015-08-07 NOTE — Progress Notes (Signed)
Pre visit review using our clinic review tool, if applicable. No additional management support is needed unless otherwise documented below in the visit note. 

## 2015-08-07 NOTE — Assessment & Plan Note (Addendum)
Ongoing cough not improving as expected with augmentin course. Treat with CTX 1gm IM x1 today, continue augmentin 10d course. No signs of CHF exacerbation.  Check CXR today - overall clear on my read.  Red flags to seek urgent care discussed. Call us next week with status update.

## 2015-08-07 NOTE — Progress Notes (Signed)
BP 124/74 mmHg  Pulse 73  Temp(Src) 97.6 F (36.4 C) (Oral)  Wt 141 lb 4 oz (64.071 kg)  SpO2 94%   CC: f/u visit, worsening cough  Subjective:    Patient ID: Rhonda Foley, female    DOB: [birth year], 82 y.o.   MRN: [id number]  HPI: Rhonda Foley is a 82 y.o. female presenting on 08/07/2015 for Follow-up and Cough   See 08/05/2015 note for details. Briefly seen by Dr Damita Dunnings with 5d h/o cough, difficulty expectorating sputum. No fevers, increased work of breathing. At that time, she had RLL rhonchi, given concern for bronchitis she was treated with augmentin 10d Foley plus mucinex.   Presents today with caregiver (DIL) with worsening cough. DIL thinks breathing is tighter and more labored.   Still no fever. Appetite ok. Voiding, stooling normally.   H/o CAP s/p hospitalization 03/2015.   Relevant past medical, surgical, family and social history reviewed and updated as indicated. Interim medical history since our last visit reviewed. Allergies and medications reviewed and updated. Current Outpatient Prescriptions on File Prior to Visit  Medication Sig  . acetaminophen (TYLENOL) 500 MG tablet Take 1,000 mg by mouth 2 (two) times daily as needed for fever (pain).   Marland Kitchen amoxicillin-clavulanate (AUGMENTIN) 875-125 MG tablet Take 1 tablet by mouth 2 (two) times daily.  . hydrochlorothiazide (MICROZIDE) 12.5 MG capsule Take one capsule by mouth daily as needed for leg swelling.  . loratadine (CLARITIN) 10 MG tablet Take 10 mg by mouth daily.  . Multiple Vitamin (MULTIVITAMIN WITH MINERALS) TABS tablet Take 1 tablet by mouth daily.  . polyethylene glycol powder (GLYCOLAX/MIRALAX) powder Take 4 g by mouth daily as needed for moderate constipation.   No current facility-administered medications on file prior to visit.    Review of Systems Per HPI unless specifically indicated in ROS section     Objective:    BP 124/74 mmHg  Pulse 73  Temp(Src) 97.6 F (36.4 C) (Oral)  Wt 141 lb  4 oz (64.071 kg)  SpO2 94%  Wt Readings from Last 3 Encounters:  08/07/15 141 lb 4 oz (64.071 kg)  08/05/15 141 lb 8 oz (64.184 kg)  03/24/15 134 lb 4 oz (60.895 kg)    Physical Exam  Constitutional: She appears well-developed and well-nourished. No distress.  HENT:  Mouth/Throat: Oropharynx is clear and moist. No oropharyngeal exudate.  Eyes: Conjunctivae and EOM are normal. Pupils are equal, round, and reactive to light. No scleral icterus.  Neck: Normal range of motion. Neck supple. No thyromegaly present.  Cardiovascular: Normal rate, regular rhythm, normal heart sounds and intact distal pulses.   No murmur heard. Pulmonary/Chest: Effort normal. No respiratory distress. She has no wheezes. She has rales (RLL).  Coarse bibasilarly  Musculoskeletal: She exhibits no edema.  Lymphadenopathy:    She has no cervical adenopathy.  Skin: Skin is warm and dry. No rash noted.  Psychiatric: She has a normal mood and affect.  Nursing note and vitals reviewed.     Assessment & Plan:   Problem List Items Addressed This Visit    Cough - Primary    Ongoing cough not improving as expected with augmentin Foley. Treat with CTX 1gm IM x1 today, continue augmentin 10d Foley. No signs of CHF exacerbation.  Check CXR today - overall clear on my read.  Red flags to seek urgent care discussed. Call us next week with status update.           Follow up plan:  Return if symptoms worsen or fail to improve.  [name], MD

## 2015-08-08 ENCOUNTER — Other Ambulatory Visit: Payer: Self-pay | Admitting: Family Medicine

## 2015-08-08 DIAGNOSIS — J189 Pneumonia, unspecified organism: Secondary | ICD-10-CM

## 2015-08-08 MED ORDER — AZITHROMYCIN 250 MG PO TABS: ORAL_TABLET | ORAL | Status: DC

## 2015-08-14 ENCOUNTER — Ambulatory Visit: Payer: PPO | Admitting: Family Medicine

## 2015-08-14 ENCOUNTER — Ambulatory Visit: Payer: PPO

## 2015-08-14 ENCOUNTER — Ambulatory Visit (INDEPENDENT_AMBULATORY_CARE_PROVIDER_SITE_OTHER): Payer: PPO

## 2015-08-14 VITALS — BP 112/78 | HR 76 | Temp 97.8°F | Ht <= 58 in | Wt 140.2 lb

## 2015-08-14 DIAGNOSIS — Z0000 Encounter for general adult medical examination without abnormal findings: Secondary | ICD-10-CM | POA: Diagnosis not present

## 2015-08-14 NOTE — Patient Instructions (Signed)
Ms. Tubman , Thank you for taking time to come for your Medicare Wellness Visit. I appreciate your ongoing commitment to your health goals. Please review the following plan we discussed and let me know if I can assist you in the future.    This is a list of the screening recommended for you and due dates:  Health Maintenance  Topic Date Due  . Pneumonia vaccines (1 of 2 - PCV13) 08/13/2016*  . DEXA scan (bone density measurement)  08/14/2023*  . Tetanus Vaccine  08/14/2023*  . Flu Shot  09/30/2015  . DTaP/Tdap/Td vaccine  Completed  . Shingles Vaccine  Completed  *Topic was postponed. The date shown is not the original due date.   Preventive Care for Adults  A healthy lifestyle and preventive care can promote health and wellness. Preventive health guidelines for adults include the following key practices.  . A routine yearly physical is a good way to check with your health care provider about your health and preventive screening. It is a chance to share any concerns and updates on your health and to receive a thorough exam.  . Visit your dentist for a routine exam and preventive care every 6 months. Brush your teeth twice a day and floss once a day. Good oral hygiene prevents tooth decay and gum disease.  . The frequency of eye exams is based on your age, health, family medical history, use  of contact lenses, and other factors. Follow your health care provider's ecommendations for frequency of eye exams.  . Eat a healthy diet. Foods like vegetables, fruits, whole grains, low-fat dairy products, and lean protein foods contain the nutrients you need without too many calories. Decrease your intake of foods high in solid fats, added sugars, and salt. Eat the right amount of calories for you. Get information about a proper diet from your health care provider, if necessary.  . Regular physical exercise is one of the most important things you can do for your health. Most adults should get at  least 150 minutes of moderate-intensity exercise (any activity that increases your heart rate and causes you to sweat) each week. In addition, most adults need muscle-strengthening exercises on 2 or more days a week.  Silver Sneakers may be a benefit available to you. To determine eligibility, you may visit the website: www.silversneakers.com or contact program at 360-348-6381 Mon-Fri between 8AM-8PM.   . Maintain a healthy weight. The body mass index (BMI) is a screening tool to identify possible weight problems. It provides an estimate of body fat based on height and weight. Your health care provider can find your BMI and can help you achieve or maintain a healthy weight.   For adults 20 years and older: ? A BMI below 18.5 is considered underweight. ? A BMI of 18.5 to 24.9 is normal. ? A BMI of 25 to 29.9 is considered overweight. ? A BMI of 30 and above is considered obese.   . Maintain normal blood lipids and cholesterol levels by exercising and minimizing your intake of saturated fat. Eat a balanced diet with plenty of fruit and vegetables. Blood tests for lipids and cholesterol should begin at age 61 and be repeated every 5 years. If your lipid or cholesterol levels are high, you are over 50, or you are at high risk for heart disease, you may need your cholesterol levels checked more frequently. Ongoing high lipid and cholesterol levels should be treated with medicines if diet and exercise are not working.  Marland Kitchen  If you smoke, find out from your health care provider how to quit. If you do not use tobacco, please do not start.  . If you choose to drink alcohol, please do not consume more than 2 drinks per day. One drink is considered to be 12 ounces (355 mL) of beer, 5 ounces (148 mL) of wine, or 1.5 ounces (44 mL) of liquor.  . If you are 80-32 years old, ask your health care provider if you should take aspirin to prevent strokes.  . Use sunscreen. Apply sunscreen liberally and repeatedly  throughout the day. You should seek shade when your shadow is shorter than you. Protect yourself by wearing long sleeves, pants, a wide-brimmed hat, and sunglasses year round, whenever you are outdoors.  . Once a month, do a whole body skin exam, using a mirror to look at the skin on your back. Tell your health care provider of new moles, moles that have irregular borders, moles that are larger than a pencil eraser, or moles that have changed in shape or color.

## 2015-08-14 NOTE — Progress Notes (Signed)
Subjective:   Rhonda Foley is a 82 y.o. female who presents for Medicare Annual (Subsequent) preventive examination.  Review of Systems:  N/A Cardiac Risk Factors include: advanced age (>40men, >7 women);obesity (BMI >30kg/m2);hypertension;dyslipidemia     Objective:     Vitals: BP 112/78 mmHg  Pulse 76  Temp(Src) 97.8 F (36.6 C) (Oral)  Ht 4\' 9"  (1.448 m)  Wt 140 lb 4 oz (63.617 kg)  BMI 30.34 kg/m2  SpO2 92%  Body mass index is 30.34 kg/(m^2).   Tobacco History  Smoking status  . Never Smoker   Smokeless tobacco  . Never Used     Counseling given: No   Past Medical History  Diagnosis Date  . HLD (hyperlipidemia) 1991    "not on RX for years" (02/24/2015)  . HTN (hypertension) 1997  . History of UTI     "infrequent"  . Cardiomegaly 04/2013    by CXR  . Thoracic compression fracture ([place]) 04/2013    by CXR  . COPD (chronic obstructive pulmonary disease) ([place]) 04/2013    by CXR  . Osteopenia 04/2013    by CXR  . Shoulder fracture, right 01/2008    "fell off the porch; put her in a brace; no OR; Dr. Reche Dixon"  . CAP (community acquired pneumonia) 02/24/2015  . Bleeding stomach ulcer 1993; 12/99  . Osteoarthritis   . Dementia 06/2011    MMSE 14/30, thought senile although fmhx alz type; did not respond to aricept trial  . Short-term memory loss    Past Surgical History  Procedure Laterality Date  . Ankle fracture surgery Right 1993  . Sigmoidoscopy  06/21/96  . US echocardiography  04/2014    ER 55-60%, (mod MR, mod TR, mod elevated PA pressures  . Fracture surgery    . Cataract extraction w/ intraocular lens  implant, bilateral Bilateral    Family History  Problem Relation Age of Onset  . Other Father     stomach problem; terminal stomach pain  . Other Father     "lost his mind"  . Alzheimer's disease Mother   . Cancer Brother   . Cancer Sister   . Dementia Sister   . Hypertension Brother   . Hypertension Sister    History  Sexual  Activity  . Sexual Activity: No    Outpatient Encounter Prescriptions as of 08/14/2015  Medication Sig  . acetaminophen (TYLENOL) 500 MG tablet Take 1,000 mg by mouth 2 (two) times daily as needed for fever (pain).   Marland Kitchen amoxicillin-clavulanate (AUGMENTIN) 875-125 MG tablet Take 1 tablet by mouth 2 (two) times daily.  Marland Kitchen azithromycin (ZITHROMAX) 250 MG tablet Take two tablets on day one followed by one tablet on days 2-5  . hydrochlorothiazide (MICROZIDE) 12.5 MG capsule Take one capsule by mouth daily as needed for leg swelling.  . loratadine (CLARITIN) 10 MG tablet Take 10 mg by mouth daily.  . Multiple Vitamin (MULTIVITAMIN WITH MINERALS) TABS tablet Take 1 tablet by mouth daily.  . polyethylene glycol powder (GLYCOLAX/MIRALAX) powder Take 4 g by mouth daily as needed for moderate constipation.   No facility-administered encounter medications on file as of 08/14/2015.    Activities of Daily Living In your present state of health, do you have any difficulty performing the following activities: 08/14/2015 02/24/2015  Hearing? Tempie Donning  Vision? N N  Difficulty concentrating or making decisions? Tempie Donning  Walking or climbing stairs? Y Y  Dressing or bathing? N Y  Doing errands, shopping?  Tempie Donning  Preparing Food and eating ? Y -  Using the Toilet? N -  In the past six months, have you accidently leaked urine? Y -  Do you have problems with loss of bowel control? N -  Managing your Medications? Y -  Managing your Finances? Y -  Housekeeping or managing your Housekeeping? Y -    Patient Care Team: [name], MD as PCP - General (Family Medicine) Birder Robson, MD as Referring Physician (Ophthalmology) Kipp Laurence, MD as Referring Physician (Audiology)    Assessment:    Hearing Screening Comments: Wears bilateral hearing aids Vision Screening Comments: Last eye exam was on May 02, 2014 with Dr. Merla Riches @ [facility]  Exercise Activities and Dietary  recommendations Current Exercise Habits: The patient does not participate in regular exercise at present, Exercise limited by: None identified  Fall Risk Fall Risk  08/14/2015  Falls in the past year? Yes  Number falls in past yr: 1  Injury with Fall? Yes  Follow up Falls evaluation completed   Depression Screen PHQ 2/9 Scores 08/14/2015 04/15/2011  PHQ - 2 Score 0 0     Cognitive Testing MMSE - Mini Mental State Exam 08/14/2015  Not completed: (No Data)  Note: dx of dementia  Immunization History  Administered Date(s) Administered  . Influenza Split 12/17/2011  . Influenza Whole 12/09/2002, 12/04/2007, 04/08/2009  . Influenza,inj,Quad PF,36+ Mos 04/05/2014  . Influenza-Unspecified 11/30/2014  . Td 09/20/2002  . Zoster 04/01/2011   Screening Tests Health Maintenance  Topic Date Due  . PNA vac Low Risk Adult (1 of 2 - PCV13) 08/13/2016 (Originally 07/16/1983)  . DEXA SCAN  08/14/2023 (Originally 07/16/1983)  . TETANUS/TDAP  08/14/2023 (Originally 09/19/2012)  . INFLUENZA VACCINE  09/30/2015  . DTaP/Tdap/Td  Completed  . ZOSTAVAX  Completed      Plan:     I have personally reviewed and addressed the Medicare Annual Wellness questionnaire and have noted the following in the patient's chart:  A. Medical and social history B. Use of alcohol, tobacco or illicit drugs  C. Current medications and supplements D. Functional ability and status E.  Nutritional status F.  Physical activity G. Advance directives H. List of other physicians I.  Hospitalizations, surgeries, and ER visits in previous 12 months J.  [place] to include hearing, vision, cognitive, depression L. Referrals and appointments - none  In addition, I have reviewed and discussed with patient certain preventive protocols, quality metrics, and best practice recommendations. A written personalized care plan for preventive services as well as general preventive health recommendations were provided to  patient.  See attached scanned questionnaire for additional information.   Signed,   Lindell Noe, MHA, BS, LPN Health Advisor

## 2015-08-14 NOTE — Progress Notes (Signed)
Pre visit review using our clinic review tool, if applicable. No additional management support is needed unless otherwise documented below in the visit note. 

## 2015-08-14 NOTE — Progress Notes (Signed)
PCP notes:  Health maintenance:  Bone density - postponed indefinitely Tetanus - postponed until needed PCV13 - postponed until immunization records reviewed  Abnormal screenings:  Fall risk - hx of fall with injury within previous 12 mths  Patient concerns: None. Daughter-in-law was concerned that patient needed additional antibiotics. PCP notified.  Nurse concerns: None  Next PCP appt: 08/28/15 @ 1115

## 2015-08-15 NOTE — Progress Notes (Signed)
I reviewed health advisor's note, was available for consultation, and agree with documentation and plan.  

## 2015-08-28 ENCOUNTER — Ambulatory Visit (INDEPENDENT_AMBULATORY_CARE_PROVIDER_SITE_OTHER)
Admission: RE | Admit: 2015-08-28 | Discharge: 2015-08-28 | Disposition: A | Discharge status: Home / Self Care | Payer: PPO | Source: Ambulatory Visit | Attending: Family Medicine | Admitting: Family Medicine

## 2015-08-28 ENCOUNTER — Encounter: Payer: Self-pay | Admitting: Family Medicine

## 2015-08-28 ENCOUNTER — Ambulatory Visit (INDEPENDENT_AMBULATORY_CARE_PROVIDER_SITE_OTHER): Payer: PPO | Admitting: Family Medicine

## 2015-08-28 VITALS — BP 120/80 | HR 73 | Temp 97.6°F | Wt 138.8 lb

## 2015-08-28 DIAGNOSIS — R05 Cough: Secondary | ICD-10-CM | POA: Diagnosis not present

## 2015-08-28 DIAGNOSIS — F03918 Unspecified dementia, unspecified severity, with other behavioral disturbance: Secondary | ICD-10-CM

## 2015-08-28 DIAGNOSIS — F0391 Unspecified dementia with behavioral disturbance: Secondary | ICD-10-CM | POA: Diagnosis not present

## 2015-08-28 DIAGNOSIS — R059 Cough, unspecified: Secondary | ICD-10-CM

## 2015-08-28 DIAGNOSIS — Z23 Encounter for immunization: Secondary | ICD-10-CM

## 2015-08-28 DIAGNOSIS — J189 Pneumonia, unspecified organism: Secondary | ICD-10-CM

## 2015-08-28 DIAGNOSIS — S8991XA Unspecified injury of right lower leg, initial encounter: Secondary | ICD-10-CM | POA: Diagnosis not present

## 2015-08-28 NOTE — Progress Notes (Signed)
BP 120/80 mmHg  Pulse 73  Temp(Src) 97.6 F (36.4 C)  Wt 138 lb 12.8 oz (62.959 kg)  SpO2 95%   CC: see prior notes for details including wellness visit  Subjective:    Patient ID: Rhonda Foley, female    DOB: [birth year], 82 y.o.   MRN: [id number]  HPI: Rhonda Foley is a 82 y.o. female presenting on 08/28/2015 for Follow-up   Presents with DIL today.  Prolonged cough last month in h/o CAP with hospitalization 03/2015. CXR showed new coarse density L lateral costophrenic gutter ?developing pneumonia - treated with augmentin 10d course + CTX 1gm IM x1. Presents for f/u CXR and office visit.  Cough has improved.  Some increasing nocturnal awakenings. Complies when asked to return to bed. She does doze throughout the day. Good light throughout the day.  Healthier diet - substituting fresh vegetables instead of breads.  Enjoys sitting out in porch. Granny's produce at her house starting up this summer.   She did have fall 3d ago at home - tripped on stop to ramp. Hit R knee.   Relevant past medical, surgical, family and social history reviewed and updated as indicated. Interim medical history since our last visit reviewed. Allergies and medications reviewed and updated. Current Outpatient Prescriptions on File Prior to Visit  Medication Sig  . acetaminophen (TYLENOL) 500 MG tablet Take 1,000 mg by mouth 2 (two) times daily as needed for fever (pain).   . hydrochlorothiazide (MICROZIDE) 12.5 MG capsule Take one capsule by mouth daily as needed for leg swelling.  . loratadine (CLARITIN) 10 MG tablet Take 10 mg by mouth daily.  . Multiple Vitamin (MULTIVITAMIN WITH MINERALS) TABS tablet Take 1 tablet by mouth daily.  . polyethylene glycol powder (GLYCOLAX/MIRALAX) powder Take 4 g by mouth daily as needed for moderate constipation.   No current facility-administered medications on file prior to visit.    Review of Systems Per HPI unless specifically indicated in ROS section    Objective:    BP 120/80 mmHg  Pulse 73  Temp(Src) 97.6 F (36.4 C)  Wt 138 lb 12.8 oz (62.959 kg)  SpO2 95%  Wt Readings from Last 3 Encounters:  08/28/15 138 lb 12.8 oz (62.959 kg)  08/14/15 140 lb 4 oz (63.617 kg)  08/07/15 141 lb 4 oz (64.071 kg)    Physical Exam  Constitutional: She appears well-developed and well-nourished. No distress.  HENT:  Mouth/Throat: Oropharynx is clear and moist. No oropharyngeal exudate.  Cardiovascular: Normal rate, regular rhythm, normal heart sounds and intact distal pulses.   No murmur heard. Pulmonary/Chest: Effort normal and breath sounds normal. No respiratory distress. She has no wheezes. She has no rales.  coarse bibasilarly  Musculoskeletal: She exhibits no edema.  FROM bilateral knees and ankles without pain to palpation No pain on ankle exam today.  Skin: Skin is warm and dry. No rash noted.  Bruising R anterior knee  Psychiatric: She has a normal mood and affect.  Nursing note and vitals reviewed.  CHEST 2 VIEW IMPRESSION: COPD. Mild interstitial prominence improved since the previous study but there is new coarse density in the left lateral costophrenic gutter which may reflect atelectasis or developing pneumonia. The patient was noted to have abnormal findings on the lung windows of an abdominal CT in January 2017. Followup PA and lateral chest X-ray is recommended in 3-4 weeks following trial of antibiotic therapy to ensure resolution and exclude underlying malignancy. If the chest has not completely cleared,  repeat chest CT scanning would be recommended. Electronically Signed  By: David Martinique M.D.  On: 08/08/2015 08:16    Assessment & Plan:   Problem List Items Addressed This Visit    Senile dementia    Some progression noted. Aricept previously ineffective. Continue to monitor. Discussed lots of light and activity during daytime.       Cough    Rpt CXR today after completed PNA treatment this month (CTX,  augmentin).  Discussed if persistent abnormality L lung they would likely decline CT as if cancer family would not want onc eval/chemotherapy given pt age.       CAP (community acquired pneumonia)    See above.  prevnar today.  DIL will check with [facility] on latest pneumovax and update [place].       Right knee injury - Primary    Benign knee and ankle exam today.           Follow up plan: Return if symptoms worsen or fail to improve.  [name], MD

## 2015-08-28 NOTE — Patient Instructions (Addendum)
Chest xray today. We will be in touch with results.  Consider memory medication.  prevnar 13 today. Check with [facility] about pneumovax administration and let me know to update chart if done.

## 2015-08-28 NOTE — Assessment & Plan Note (Addendum)
Rpt CXR today after completed PNA treatment this month (CTX, augmentin).  Discussed if persistent abnormality L lung they would likely decline CT as if cancer family would not want onc eval/chemotherapy given pt age.

## 2015-08-28 NOTE — Assessment & Plan Note (Signed)
Benign knee and ankle exam today.

## 2015-08-28 NOTE — Assessment & Plan Note (Addendum)
Some progression noted. Aricept previously ineffective. Continue to monitor. Discussed lots of light and activity during daytime.

## 2015-08-28 NOTE — Assessment & Plan Note (Addendum)
See above.  prevnar today.  DIL will check with [facility] on latest pneumovax and update [place].

## 2015-08-28 NOTE — Progress Notes (Signed)
Pre visit review using our clinic review tool, if applicable. No additional management support is needed unless otherwise documented below in the visit note. 

## 2015-11-14 ENCOUNTER — Other Ambulatory Visit: Payer: Self-pay | Admitting: Family Medicine

## 2016-01-05 ENCOUNTER — Ambulatory Visit: Payer: PPO

## 2016-02-26 ENCOUNTER — Ambulatory Visit (INDEPENDENT_AMBULATORY_CARE_PROVIDER_SITE_OTHER): Payer: PPO | Admitting: Family Medicine

## 2016-02-26 ENCOUNTER — Encounter: Payer: Self-pay | Admitting: Family Medicine

## 2016-02-26 VITALS — BP 118/82 | HR 72 | Temp 98.0°F | Wt 137.5 lb

## 2016-02-26 DIAGNOSIS — F0391 Unspecified dementia with behavioral disturbance: Secondary | ICD-10-CM

## 2016-02-26 DIAGNOSIS — I10 Essential (primary) hypertension: Secondary | ICD-10-CM

## 2016-02-26 DIAGNOSIS — J310 Chronic rhinitis: Secondary | ICD-10-CM | POA: Diagnosis not present

## 2016-02-26 DIAGNOSIS — R0602 Shortness of breath: Secondary | ICD-10-CM | POA: Diagnosis not present

## 2016-02-26 DIAGNOSIS — F03918 Unspecified dementia, unspecified severity, with other behavioral disturbance: Secondary | ICD-10-CM

## 2016-02-26 MED ORDER — AZELASTINE HCL 0.1 % NA SOLN: 1.0000 | Freq: Two times a day (BID) | NASAL | 3 refills | Status: DC

## 2016-02-26 NOTE — Assessment & Plan Note (Signed)
Ongoing exertional. Anticipate deconditioning + age. H/o cardiomegaly by CXR. Continue HCTZ 12.5mg  daily

## 2016-02-26 NOTE — Assessment & Plan Note (Signed)
Failed oral antihistamines. Will trial astelin nasal spray.

## 2016-02-26 NOTE — Progress Notes (Signed)
BP 118/82   Pulse 72   Temp 98 F (36.7 C) (Oral)   Wt 137 lb 8 oz (62.4 kg)   BMI 29.75 kg/m    CC: 51mo f/u visit Subjective:    Patient ID: Rhonda Foley, female    DOB: September 29, 1916, 82 y.o.   MRN: [id number]  HPI: Rhonda Foley is a 82 y.o. female presenting on 02/26/2016 for Follow-up   Here with DIL. She did have nice Christmas, had 25 family members to her house christmas night - pt does not remember this. She enjoys looking at her photos - has separate bedroom with bed full of photos which she frequents several times a day  4d ago had episode of unsteadiness - falling to the left. Tuesday she endorsed seeing someone "that was outside" and no one was there. BP 130/80. Possibly more progressive unsteadiness noted. Despite this, continues to walk daily. More unsure of herself in big lot stores and when in care with lots of traffic. No dysuria, increased urinary urgency, increased confusion.   Ongoing chronic rhinorrhea not improved with several different oral antihistamines.   No trouble with ADLs. Some assistance needed with cleanliness.  A caregiver stays with her 24 hr/day.  She has been able to stay at home.  Taking miralax daily with occasional need for stool softeners.  Appetite too good - caregivers have to monitor her food intake.  Relevant past medical, surgical, family and social history reviewed and updated as indicated. Interim medical history since our last visit reviewed. Allergies and medications reviewed and updated. Current Outpatient Prescriptions on File Prior to Visit  Medication Sig  . acetaminophen (TYLENOL) 500 MG tablet Take 1,000 mg by mouth 2 (two) times daily as needed for fever (pain).   . hydrochlorothiazide (MICROZIDE) 12.5 MG capsule TAKE ONE CAPSULE BY MOUTH DAILY AS NEEDED FOR LEG SWELLING.  Marland Kitchen loratadine (CLARITIN) 10 MG tablet Take 10 mg by mouth daily.  . Multiple Vitamin (MULTIVITAMIN WITH MINERALS) TABS tablet Take 1 tablet by mouth  daily.  . polyethylene glycol powder (GLYCOLAX/MIRALAX) powder Take 4 g by mouth daily as needed for moderate constipation.   No current facility-administered medications on file prior to visit.     Review of Systems Per HPI unless specifically indicated in ROS section     Objective:    BP 118/82   Pulse 72   Temp 98 F (36.7 C) (Oral)   Wt 137 lb 8 oz (62.4 kg)   BMI 29.75 kg/m   Wt Readings from Last 3 Encounters:  02/26/16 137 lb 8 oz (62.4 kg)  08/28/15 138 lb 12.8 oz (63 kg)  08/14/15 140 lb 4 oz (63.6 kg)    Physical Exam  Constitutional: She appears well-developed and well-nourished. No distress.  HENT:  Nose: Rhinorrhea present. No mucosal edema. Right sinus exhibits no maxillary sinus tenderness and no frontal sinus tenderness. Left sinus exhibits no maxillary sinus tenderness and no frontal sinus tenderness.  Mouth/Throat: Oropharynx is clear and moist. No oropharyngeal exudate.  Cardiovascular: Normal rate, regular rhythm, normal heart sounds and intact distal pulses.   No murmur heard. Pulmonary/Chest: Effort normal. No respiratory distress. She has no wheezes. She has no rales.  Crackles bilateral lungs  Musculoskeletal: She exhibits no edema.  Skin: Skin is warm and dry. No rash noted.  Psychiatric: She has a normal mood and affect.  Polite and calm, responds to questions appropriately, evident memory trouble.   Nursing note and vitals reviewed.  Assessment & Plan:   Problem List Items Addressed This Visit    Chronic rhinitis    Failed oral antihistamines. Will trial astelin nasal spray.       Dyspnea    Ongoing exertional. Anticipate deconditioning + age. H/o cardiomegaly by CXR. Continue HCTZ 12.5mg  daily      Essential hypertension    Chronic, stable. Continue current regimen.      Senile dementia - Primary    Stable off medication. Mild progression but no change in ADL's, doing well at home with 24 hour supervision, no increased level of  care need noted today. Possible rare visual hallucination - will continue to monitor Commended caregivers on care of patient to date - with evident social and family support, daily outings for walking. Continue this.           Follow up plan: Return in about 6 months (around 08/26/2016) for follow up visit.  [name], MD

## 2016-02-26 NOTE — Assessment & Plan Note (Signed)
Chronic, stable. Continue current regimen. 

## 2016-02-26 NOTE — Assessment & Plan Note (Addendum)
Stable off medication. Mild progression but no change in ADL's, doing well at home with 24 hour supervision, no increased level of care need noted today. Possible rare visual hallucination - will continue to monitor Commended caregivers on care of patient to date - with evident social and family support, daily outings for walking. Continue this.

## 2016-02-26 NOTE — Progress Notes (Signed)
Pre visit review using our clinic review tool, if applicable. No additional management support is needed unless otherwise documented below in the visit note. 

## 2016-02-26 NOTE — Patient Instructions (Addendum)
Try astelin nasal spray to nose for runny nose.  Continue current medicines otherwise. I think you are doing well.  Return as needed or in 6 months for wellness visit or follow up

## 2016-05-06 ENCOUNTER — Other Ambulatory Visit: Payer: Self-pay | Admitting: Family Medicine

## 2016-05-24 ENCOUNTER — Ambulatory Visit (INDEPENDENT_AMBULATORY_CARE_PROVIDER_SITE_OTHER): Payer: PPO | Admitting: Family Medicine

## 2016-05-24 ENCOUNTER — Encounter (INDEPENDENT_AMBULATORY_CARE_PROVIDER_SITE_OTHER): Payer: Self-pay

## 2016-05-24 ENCOUNTER — Encounter: Payer: Self-pay | Admitting: Family Medicine

## 2016-05-24 VITALS — BP 116/80 | HR 98 | Temp 97.4°F | Ht <= 58 in | Wt 141.0 lb

## 2016-05-24 DIAGNOSIS — F0390 Unspecified dementia without behavioral disturbance: Secondary | ICD-10-CM | POA: Diagnosis not present

## 2016-05-24 DIAGNOSIS — R410 Disorientation, unspecified: Secondary | ICD-10-CM

## 2016-05-24 DIAGNOSIS — J189 Pneumonia, unspecified organism: Secondary | ICD-10-CM

## 2016-05-24 LAB — POC URINALSYSI DIPSTICK (AUTOMATED)
BILIRUBIN UA: NEGATIVE
GLUCOSE UA: NEGATIVE
Ketones, UA: NEGATIVE
NITRITE UA: NEGATIVE
Protein, UA: NEGATIVE
RBC UA: NEGATIVE
Spec Grav, UA: 1.03 (ref 1.030–1.035)
UROBILINOGEN UA: 0.2 (ref ?–2.0)
pH, UA: 6 (ref 5.0–8.0)

## 2016-05-24 MED ORDER — LEVOFLOXACIN 500 MG PO TABS: 500.0000 mg | ORAL_TABLET | Freq: Every day | ORAL | 0 refills | Status: DC

## 2016-05-24 NOTE — Progress Notes (Signed)
Pre visit review using our clinic review tool, if applicable. No additional management support is needed unless otherwise documented below in the visit note. 

## 2016-05-24 NOTE — Progress Notes (Signed)
Dr. Frederico Hamman T. Kielyn Kardell, MD, [facility] [place], [address] Phone: [phone] Fax: [phone]  05/24/2016  Patient: Rhonda Foley, MRN: [id number], DOB: 12-20-1916, 83 y.o.  Primary Physician:  [name], MD   Chief Complaint  Patient presents with  . Cough    Lower Sternum Pain  . Altered Mental Status   Subjective:   Rhonda Foley is a 83 y.o. very pleasant female patient who presents with the following:  83 yo with dementia - worsening over the last week.   Has congestion, nasal congestion and sneezing a lot and coughing a lot.  Cough really started in the last week.  Cough has worsened a lot in the last 2 days.  Has had some increased confusion last week - up a lot last week.  Had some midline pain - wincing and hurt.  Some pain in her back / shoulder blade region, too.  Regular BM.    Past Medical History, Surgical History, Social History, Family History, Problem List, Medications, and Allergies have been reviewed and updated if relevant.  Patient Active Problem List   Diagnosis Date Noted  . Chronic rhinitis 02/26/2016  . Right knee injury 08/28/2015  . Advanced care planning/counseling discussion 03/10/2015  . Abnormal CT scan, lung 03/07/2015  . Lung nodule, solitary 02/24/2015  . Chest discomfort 10/24/2014  . Right knee pain 10/24/2014  . Dyspnea 04/05/2014  . Cardiomegaly 04/05/2014  . Cough 04/09/2013  . Bilateral venous insufficiency 09/01/2011  . Medicare annual wellness visit, initial 03/17/2011  . Senile dementia 03/17/2011  . HYPERLIPIDEMIA 10/19/2006  . Essential hypertension 10/19/2006  . GASTROINTESTINAL HEMORRHAGE, HX OF 10/19/2006    Past Medical History:  Diagnosis Date  . Bleeding stomach ulcer 1993; 12/99  . CAP (community acquired pneumonia) 02/24/2015  . Cardiomegaly 04/2013   by CXR  . COPD (chronic obstructive pulmonary disease) ([place]) 04/2013   by CXR    . Dementia 06/2011   MMSE 14/30, thought senile although fmhx alz type; did not respond to aricept trial  . History of UTI    "infrequent"  . HLD (hyperlipidemia) 1991   "not on RX for years" (02/24/2015)  . HTN (hypertension) 1997  . Osteoarthritis   . Osteopenia 04/2013   by CXR  . Short-term memory loss   . Shoulder fracture, right 01/2008   "fell off the porch; put her in a brace; no OR; Dr. Reche Dixon"  . Thoracic compression fracture ([place]) 04/2013   by CXR    Past Surgical History:  Procedure Laterality Date  . ANKLE FRACTURE SURGERY Right 1993  . CATARACT EXTRACTION W/ INTRAOCULAR LENS  IMPLANT, BILATERAL Bilateral   . FRACTURE SURGERY    . SIGMOIDOSCOPY  06/21/96  . US ECHOCARDIOGRAPHY  04/2014   ER 55-60%, (mod MR, mod TR, mod elevated PA pressures    Social History   Social History  . Marital status: Widowed    Spouse name: N/A  . Number of children: 3  . Years of education: N/A   Occupational History  . retired    Social History Main Topics  . Smoking status: Never Smoker  . Smokeless tobacco: Never Used  . Alcohol use No  . Drug use: No  . Sexual activity: No   Other Topics Concern  . Not on file   Social History Narrative   Lives at home, 24/7 caregiver.   Niece and DIL also involved   Family  cooks for her.   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    Family History  Problem Relation Age of Onset  . Other Father     stomach problem; terminal stomach pain  . Other Father     "lost his mind"  . Alzheimer's disease Mother   . Cancer Brother   . Cancer Sister   . Dementia Sister   . Hypertension Brother   . Hypertension Sister     Allergies  Allergen Reactions  . Nsaids Nausea And Vomiting and Other (See Comments)    Pt is not supposed to take because of GI bleed    Medication list reviewed and updated in full in [place].  ROS: GEN: Acute illness details above GI: Tolerating PO intake GU: maintaining  adequate hydration and urination Pulm: No SOB Interactive and getting along well at home.  Otherwise, ROS is as per the HPI.  Objective:   BP 116/80   Pulse 98   Temp 97.4 F (36.3 C) (Oral)   Ht 4\' 9"  (1.448 m)   Wt 141 lb (64 kg)   SpO2 94%   BMI 30.51 kg/m    GEN: pleasant. WDWN. NAD.    ENT: Nose clear, ext NML.  No LAD.  No JVD.  TM's clear. Oropharynx clear.  PULM: Normal WOB, no distress. Crackles L > R lung bases. CV: RRR, no M/G/R, No rubs, No JVD.   EXT: warm and well-perfused, No c/c/e. PSYCH: Pleasant and conversant.    Laboratory and Imaging Data: Results for orders placed or performed in visit on 05/24/16  POCT Urinalysis Dipstick (Automated)  Result Value Ref Range   Color, UA yellow    Clarity, UA clear    Glucose, UA negative    Bilirubin, UA negative    Ketones, UA negative    Spec Grav, UA 1.030 1.030 - 1.035   Blood, UA negative    pH, UA 6.0 5.0 - 8.0   Protein, UA negative    Urobilinogen, UA 0.2 Negative - 2.0   Nitrite, UA negative    Leukocytes, UA small (1+) (A) Negative     Assessment and Plan:   Community acquired pneumonia, unspecified laterality  Confusion - Plan: POCT Urinalysis Dipstick (Automated), Urine culture  Senile dementia without behavioral disturbance  Given crackles on exam, most likely etiology is CAP. Most of history from caregiver.   LE on UA, cannot exclude UTI  LVQ to cover both  f/u if worsens at all.  Follow-up: No Follow-up on file.  Meds ordered this encounter  Medications  . levofloxacin (LEVAQUIN) 500 MG tablet    Sig: Take 1 tablet (500 mg total) by mouth daily.    Dispense:  10 tablet    Refill:  0   Medications Discontinued During This Encounter  Medication Reason  . loratadine (CLARITIN) 10 MG tablet Completed Course   Orders Placed This Encounter  Procedures  . Urine culture  . POCT Urinalysis Dipstick (Automated)    Signed,  Jervis Trapani T. Missie Gehrig, MD   Allergies as of 05/24/2016       Reactions   Nsaids Nausea And Vomiting, Other (See Comments)   Pt is not supposed to take because of GI bleed      Medication List       Accurate as of 05/24/16  2:03 PM. Always use your most recent med list.          acetaminophen 500 MG tablet Commonly known as:  TYLENOL Take 1,000 mg by mouth 2 (two) times daily as needed for fever (pain).   azelastine 0.1 % nasal spray Commonly known as:  ASTELIN Place 1 spray into both nostrils 2 (two) times daily. Use in each nostril as directed   cetirizine 10 MG tablet Commonly known as:  ZYRTEC Take 10 mg by mouth daily.   hydrochlorothiazide 12.5 MG capsule Commonly known as:  MICROZIDE TAKE ONE CAPSULE BY MOUTH DAILY AS NEEDED FOR LEG SWELLING.   levofloxacin 500 MG tablet Commonly known as:  LEVAQUIN Take 1 tablet (500 mg total) by mouth daily.   multivitamin with minerals Tabs tablet Take 1 tablet by mouth daily.   polyethylene glycol powder powder Commonly known as:  GLYCOLAX/MIRALAX Take 4 g by mouth daily as needed for moderate constipation.

## 2016-05-25 LAB — URINE CULTURE

## 2016-05-26 ENCOUNTER — Telehealth: Payer: Self-pay | Admitting: Family Medicine

## 2016-05-26 DIAGNOSIS — R1084 Generalized abdominal pain: Secondary | ICD-10-CM

## 2016-05-26 NOTE — Telephone Encounter (Signed)
Patient's daughter returned Donna's call.

## 2016-05-26 NOTE — Telephone Encounter (Signed)
Rhonda Foley (daughter) notified by telephone that her mom's urine culture was negative, but Dr. Lorelei Pont thought it would be reasonable to have her finish all of the Levaquin given pulmonary/ respiratory illness.  Rhonda Foley is scheduled to follow up with Dr. Lorelei Pont tomorrow because she is still complaining of the pain up under her rib cage.

## 2016-05-27 ENCOUNTER — Encounter: Payer: Self-pay | Admitting: Family Medicine

## 2016-05-27 ENCOUNTER — Ambulatory Visit (INDEPENDENT_AMBULATORY_CARE_PROVIDER_SITE_OTHER): Payer: PPO | Admitting: Family Medicine

## 2016-05-27 VITALS — BP 138/91 | HR 80 | Temp 97.6°F | Ht <= 58 in | Wt 139.8 lb

## 2016-05-27 DIAGNOSIS — R05 Cough: Secondary | ICD-10-CM

## 2016-05-27 DIAGNOSIS — R1013 Epigastric pain: Secondary | ICD-10-CM

## 2016-05-27 DIAGNOSIS — R0989 Other specified symptoms and signs involving the circulatory and respiratory systems: Secondary | ICD-10-CM

## 2016-05-27 DIAGNOSIS — R059 Cough, unspecified: Secondary | ICD-10-CM

## 2016-05-27 DIAGNOSIS — F0390 Unspecified dementia without behavioral disturbance: Secondary | ICD-10-CM

## 2016-05-27 LAB — CBC WITH DIFFERENTIAL/PLATELET
Basophils Absolute: 0.1 10*3/uL (ref 0.0–0.1)
Basophils Relative: 0.9 % (ref 0.0–3.0)
Eosinophils Absolute: 0.2 10*3/uL (ref 0.0–0.7)
Eosinophils Relative: 2.9 % (ref 0.0–5.0)
HEMATOCRIT: 41.2 % (ref 36.0–46.0)
HEMOGLOBIN: 13.7 g/dL (ref 12.0–15.0)
LYMPHS PCT: 31.5 % (ref 12.0–46.0)
Lymphs Abs: 2.5 10*3/uL (ref 0.7–4.0)
MCHC: 33.3 g/dL (ref 30.0–36.0)
MCV: 88.6 fl (ref 78.0–100.0)
MONOS PCT: 13.3 % — AB (ref 3.0–12.0)
Monocytes Absolute: 1 10*3/uL (ref 0.1–1.0)
NEUTROS ABS: 4 10*3/uL (ref 1.4–7.7)
Neutrophils Relative %: 51.4 % (ref 43.0–77.0)
PLATELETS: 368 10*3/uL (ref 150.0–400.0)
RBC: 4.64 Mil/uL (ref 3.87–5.11)
RDW: 14.1 % (ref 11.5–15.5)
WBC: 7.8 10*3/uL (ref 4.0–10.5)

## 2016-05-27 LAB — LIPASE: LIPASE: 23 U/L (ref 11.0–59.0)

## 2016-05-27 LAB — HEPATIC FUNCTION PANEL
ALT: 11 U/L (ref 0–35)
AST: 18 U/L (ref 0–37)
Albumin: 3.9 g/dL (ref 3.5–5.2)
Alkaline Phosphatase: 63 U/L (ref 39–117)
BILIRUBIN TOTAL: 0.2 mg/dL (ref 0.2–1.2)
Bilirubin, Direct: 0 mg/dL (ref 0.0–0.3)
TOTAL PROTEIN: 7 g/dL (ref 6.0–8.3)

## 2016-05-27 LAB — BASIC METABOLIC PANEL
BUN: 22 mg/dL (ref 6–23)
CALCIUM: 10 mg/dL (ref 8.4–10.5)
CO2: 32 meq/L (ref 19–32)
CREATININE: 0.71 mg/dL (ref 0.40–1.20)
Chloride: 100 mEq/L (ref 96–112)
GFR: 80.81 mL/min (ref >60.00)
Glucose, Bld: 102 mg/dL — ABNORMAL HIGH (ref 70–99)
Potassium: 3.6 mEq/L (ref 3.5–5.1)
SODIUM: 140 meq/L (ref 135–145)

## 2016-05-27 LAB — AMYLASE: AMYLASE: 40 U/L (ref 27–131)

## 2016-05-27 LAB — H. PYLORI ANTIBODY, IGG: H Pylori IgG: NEGATIVE

## 2016-05-27 NOTE — Progress Notes (Signed)
Dr. Frederico Hamman T. Tonantzin Mimnaugh, MD, [facility] Sports Medicine [place], [address] Phone: [phone] Fax: [phone]  05/27/2016  Patient: Rhonda Foley, MRN: [id number], DOB: 07-Aug-1916, 83 y.o.  Primary Physician:  [name], MD   Chief Complaint  Patient presents with  . Abdominal Pain   Subjective:   Rhonda Foley is a 83 y.o. very pleasant female patient who presents with the following:  f/u patient recently seen with crackles and presumed CAP treated with LVQ. Cough and SOB is improved.  Urine culture negative.   Pain is present to touch on the epigastric region. Right under the rib cage. Chest is much better, cough is better than a few days ago.   Consistently has been complaining of abd pain and some distension.  05/24/2016 Last OV with Owens Loffler, MD  83 yo with dementia - worsening over the last week.   Has congestion, nasal congestion and sneezing a lot and coughing a lot.  Cough really started in the last week.  Cough has worsened a lot in the last 2 days.  Has had some increased confusion last week - up a lot last week.  Had some midline pain - wincing and hurt.  Some pain in her back / shoulder blade region, too.  Regular BM.    Past Medical History, Surgical History, Social History, Family History, Problem List, Medications, and Allergies have been reviewed and updated if relevant.  Patient Active Problem List   Diagnosis Date Noted  . Chronic rhinitis 02/26/2016  . Right knee injury 08/28/2015  . Advanced care planning/counseling discussion 03/10/2015  . Abnormal CT scan, lung 03/07/2015  . Lung nodule, solitary 02/24/2015  . Chest discomfort 10/24/2014  . Right knee pain 10/24/2014  . Dyspnea 04/05/2014  . Cardiomegaly 04/05/2014  . Cough 04/09/2013  . Bilateral venous insufficiency 09/01/2011  . Medicare annual wellness visit, initial 03/17/2011  . Senile dementia 03/17/2011  .  HYPERLIPIDEMIA 10/19/2006  . Essential hypertension 10/19/2006  . GASTROINTESTINAL HEMORRHAGE, HX OF 10/19/2006    Past Medical History:  Diagnosis Date  . Bleeding stomach ulcer 1993; 12/99  . CAP (community acquired pneumonia) 02/24/2015  . Cardiomegaly 04/2013   by CXR  . COPD (chronic obstructive pulmonary disease) ([place]) 04/2013   by CXR  . Dementia 06/2011   MMSE 14/30, thought senile although fmhx alz type; did not respond to aricept trial  . History of UTI    "infrequent"  . HLD (hyperlipidemia) 1991   "not on RX for years" (02/24/2015)  . HTN (hypertension) 1997  . Osteoarthritis   . Osteopenia 04/2013   by CXR  . Short-term memory loss   . Shoulder fracture, right 01/2008   "fell off the porch; put her in a brace; no OR; Dr. Reche Dixon"  . Thoracic compression fracture ([place]) 04/2013   by CXR    Past Surgical History:  Procedure Laterality Date  . ANKLE FRACTURE SURGERY Right 1993  . CATARACT EXTRACTION W/ INTRAOCULAR LENS  IMPLANT, BILATERAL Bilateral   . FRACTURE SURGERY    . SIGMOIDOSCOPY  06/21/96  . US ECHOCARDIOGRAPHY  04/2014   ER 55-60%, (mod MR, mod TR, mod elevated PA pressures    Social History   Social History  . Marital status: Widowed    Spouse name: N/A  . Number of children: 3  . Years of education: N/A   Occupational History  . retired    Social History Main Topics  .  Smoking status: Never Smoker  . Smokeless tobacco: Never Used  . Alcohol use No  . Drug use: No  . Sexual activity: No   Other Topics Concern  . Not on file   Social History Narrative   Lives at home, 24/7 caregiver.   Niece and DIL also involved   Family cooks for her.   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    Family History  Problem Relation Age of Onset  . Other Father     stomach problem; terminal stomach pain  . Other Father     "lost his mind"  . Alzheimer's disease Mother   . Cancer Brother   . Cancer Sister   . Dementia Sister   .  Hypertension Brother   . Hypertension Sister     Allergies  Allergen Reactions  . Nsaids Nausea And Vomiting and Other (See Comments)    Pt is not supposed to take because of GI bleed   ROS: GEN: Acute illness details above GI: Tolerating PO intake GU: maintaining adequate hydration and urination Pulm: No SOB Interactive and getting along well at home.  Otherwise, ROS is as per the HPI.   Objective:   BP (!) 138/91   Pulse 80   Temp 97.6 F (36.4 C) (Oral)   Ht 4\' 9"  (1.448 m)   Wt 139 lb 12 oz (63.4 kg)   SpO2 97%   BMI 30.24 kg/m   GEN: WDWN, NAD, Non-toxic, A & O x 3 HEENT: Atraumatic, Normocephalic. Neck supple. No masses, No LAD. Ears and Nose: No external deformity. CV: RRR, No M/G/R. No JVD. No thrill. No extra heart sounds. PULM: no wheezes, crackles + L > R but improved, no rhonchi. No retractions. No resp. distress. No accessory muscle use. ABD: S, mild epigastric pain, ND, +BS. No rebound. No HSM. EXTR: No c/c/e NEURO Normal gait.  PSYCH: Normally interactive. Conversant. Not depressed or anxious appearing.  Calm demeanor.     Laboratory and Imaging Data: Results for orders placed or performed in visit on 05/24/16  Urine culture  Result Value Ref Range   Organism ID, Bacteria      Multiple organisms present,each less than 10,000 CFU/mL. These organisms,commonly found on external and internal genitalia,are considered colonizers. No further testing performed.   POCT Urinalysis Dipstick (Automated)  Result Value Ref Range   Color, UA yellow    Clarity, UA clear    Glucose, UA negative    Bilirubin, UA negative    Ketones, UA negative    Spec Grav, UA 1.030 1.030 - 1.035   Blood, UA negative    pH, UA 6.0 5.0 - 8.0   Protein, UA negative    Urobilinogen, UA 0.2 Negative - 2.0   Nitrite, UA negative    Leukocytes, UA small (1+) (A) Negative     Assessment and Plan:   Epigastric abdominal pain - Plan: Basic metabolic panel, CBC with  Differential/Platelet, H. pylori antibody, IgG, Hepatic function panel, Lipase, Amylase  Cough  Lung crackles  Senile dementia without behavioral disturbance  Improving CAP.  Ongoing abd pain of unclear source. Additional history by daughter in law. Begin abd pain work-up.  Follow-up: No Follow-up on file.  Orders Placed This Encounter  Procedures  . Basic metabolic panel  . CBC with Differential/Platelet  . H. pylori antibody, IgG  . Hepatic function panel  . Lipase  . Amylase    Signed,  Frederico Hamman T. Lalita Ebel, MD   Allergies as  of 05/27/2016      Reactions   Nsaids Nausea And Vomiting, Other (See Comments)   Pt is not supposed to take because of GI bleed      Medication List       Accurate as of 05/27/16  3:07 PM. Always use your most recent med list.          acetaminophen 500 MG tablet Commonly known as:  TYLENOL Take 1,000 mg by mouth 2 (two) times daily as needed for fever (pain).   azelastine 0.1 % nasal spray Commonly known as:  ASTELIN Place 1 spray into both nostrils 2 (two) times daily. Use in each nostril as directed   cetirizine 10 MG tablet Commonly known as:  ZYRTEC Take 10 mg by mouth daily.   hydrochlorothiazide 12.5 MG capsule Commonly known as:  MICROZIDE TAKE ONE CAPSULE BY MOUTH DAILY AS NEEDED FOR LEG SWELLING.   levofloxacin 500 MG tablet Commonly known as:  LEVAQUIN Take 1 tablet (500 mg total) by mouth daily.   multivitamin with minerals Tabs tablet Take 1 tablet by mouth daily.   polyethylene glycol powder powder Commonly known as:  GLYCOLAX/MIRALAX Take 4 g by mouth daily as needed for moderate constipation.

## 2016-05-27 NOTE — Progress Notes (Signed)
Pre visit review using our clinic review tool, if applicable. No additional management support is needed unless otherwise documented below in the visit note. 

## 2016-05-31 NOTE — Telephone Encounter (Signed)
Rhonda Foley (Daughter) notified  by telephone that her mom's lab work was normal.  She states her mom is still having the stomach pain and wants to know where we go from here.  Per Dr. Lorelei Pont, he will order some imagining studies of [name]'s abdomen and will go from there depending on what they show.

## 2016-05-31 NOTE — Telephone Encounter (Signed)
Noted - can you call - see my earlier result note regarding labs.

## 2016-05-31 NOTE — Addendum Note (Signed)
Addended by: Owens Loffler on: 05/31/2016 10:26 AM   Modules accepted: Orders

## 2016-05-31 NOTE — Telephone Encounter (Signed)
Pt daughter, [name], called to check 3/29 results. She is requesting a cb by 11am today since she is leaving the country this morning. [phone] She understood Dr. Lorelei Pont has a full schedule today and it may be the end of the day before a cb.

## 2016-05-31 NOTE — Telephone Encounter (Signed)
[place] abd signed

## 2016-06-04 ENCOUNTER — Ambulatory Visit
Admission: RE | Admit: 2016-06-04 | Discharge: 2016-06-04 | Disposition: A | Discharge status: Home / Self Care | Payer: PPO | Source: Ambulatory Visit | Attending: Family Medicine | Admitting: Family Medicine

## 2016-06-04 DIAGNOSIS — R1084 Generalized abdominal pain: Secondary | ICD-10-CM

## 2016-06-07 ENCOUNTER — Other Ambulatory Visit: Payer: Self-pay | Admitting: Family Medicine

## 2016-06-07 DIAGNOSIS — R935 Abnormal findings on diagnostic imaging of other abdominal regions, including retroperitoneum: Secondary | ICD-10-CM

## 2016-06-07 DIAGNOSIS — R1013 Epigastric pain: Secondary | ICD-10-CM

## 2016-06-09 ENCOUNTER — Other Ambulatory Visit: Payer: Self-pay | Admitting: Family Medicine

## 2016-06-09 ENCOUNTER — Telehealth: Payer: Self-pay | Admitting: Family Medicine

## 2016-06-09 DIAGNOSIS — R935 Abnormal findings on diagnostic imaging of other abdominal regions, including retroperitoneum: Secondary | ICD-10-CM

## 2016-06-09 DIAGNOSIS — R1013 Epigastric pain: Secondary | ICD-10-CM

## 2016-06-09 NOTE — Telephone Encounter (Signed)
Colletta Maryland from [facility] called - she needs a corrected order  Needs to be ct abdomen with and without - pancreatic protocol  cb number is [phone]

## 2016-06-09 NOTE — Telephone Encounter (Signed)
I am unable to cancel previous CT order due to they have her appointment linked to that order, so it want let me cancel.

## 2016-06-09 NOTE — Telephone Encounter (Signed)
I ordered this again. I don't know how to cancel it.

## 2016-06-11 ENCOUNTER — Ambulatory Visit
Admission: RE | Admit: 2016-06-11 | Discharge: 2016-06-11 | Disposition: A | Discharge status: Home / Self Care | Payer: PPO | Source: Ambulatory Visit | Attending: Family Medicine | Admitting: Family Medicine

## 2016-06-11 DIAGNOSIS — K8689 Other specified diseases of pancreas: Secondary | ICD-10-CM | POA: Diagnosis not present

## 2016-06-11 DIAGNOSIS — I700 Atherosclerosis of aorta: Secondary | ICD-10-CM | POA: Diagnosis not present

## 2016-06-11 DIAGNOSIS — R1013 Epigastric pain: Secondary | ICD-10-CM

## 2016-06-11 DIAGNOSIS — R935 Abnormal findings on diagnostic imaging of other abdominal regions, including retroperitoneum: Secondary | ICD-10-CM

## 2016-06-11 DIAGNOSIS — I2510 Atherosclerotic heart disease of native coronary artery without angina pectoris: Secondary | ICD-10-CM | POA: Insufficient documentation

## 2016-06-11 DIAGNOSIS — K5710 Diverticulosis of small intestine without perforation or abscess without bleeding: Secondary | ICD-10-CM | POA: Diagnosis not present

## 2016-06-11 MED ORDER — IOPAMIDOL (ISOVUE-370) INJECTION 76%
75.0000 mL | Freq: Once | INTRAVENOUS | Status: AC | PRN
  Administered 2016-06-11: 75 mL via INTRAVENOUS

## 2016-08-16 ENCOUNTER — Other Ambulatory Visit: Payer: Self-pay | Admitting: Family Medicine

## 2016-11-22 ENCOUNTER — Encounter: Payer: Self-pay | Admitting: Family Medicine

## 2016-11-22 ENCOUNTER — Ambulatory Visit (INDEPENDENT_AMBULATORY_CARE_PROVIDER_SITE_OTHER): Payer: PPO | Admitting: Family Medicine

## 2016-11-22 VITALS — BP 120/74 | HR 69 | Temp 97.6°F | Wt 139.5 lb

## 2016-11-22 DIAGNOSIS — R059 Cough, unspecified: Secondary | ICD-10-CM

## 2016-11-22 DIAGNOSIS — Z23 Encounter for immunization: Secondary | ICD-10-CM

## 2016-11-22 DIAGNOSIS — R05 Cough: Secondary | ICD-10-CM | POA: Diagnosis not present

## 2016-11-22 MED ORDER — FUROSEMIDE 20 MG PO TABS: 10.0000 mg | ORAL_TABLET | Freq: Every day | ORAL | 1 refills | Status: DC | PRN

## 2016-11-22 NOTE — Progress Notes (Signed)
BP 120/74 (BP Location: Left Arm, Patient Position: Sitting, Cuff Size: Normal)   Pulse 69   Temp 97.6 F (36.4 C) (Oral)   Wt 139 lb 8 oz (63.3 kg)   SpO2 99%   BMI 30.19 kg/m    CC: cough Subjective:    Patient ID: Rhonda Foley, female    DOB: [birth year], 83 y.o.   MRN: [id number]  HPI: Rhonda Foley is a 83 y.o. female presenting on 11/22/2016 for Cough (Started last week) and Wheezing (Since yesterday. H/o pneumonia)   Here with DIL.   1 wk h/o cough and wheezing, worse yesterday and more dyspneic with exertion. Today a bit better. Wet sounding cough. Chronic rhinorrhea. Some nasal congestion.   No fevers/chills, no ear or tooth pain, headache, ST. No abd pain.  Has tried mucinex.   H/o PNA earlier this year. COPD by CXR. No known asthma.  Non smoker.   DIL notes some abdominal fullness intermittently.   H/o abnormal CT scan 03/2015 - raising concern for atypical infection like MAI. After discussion with family, this was not treated but we rather decided to monitor for worsening symptoms.  Relevant past medical, surgical, family and social history reviewed and updated as indicated. Interim medical history since our last visit reviewed. Allergies and medications reviewed and updated. Outpatient Medications Prior to Visit  Medication Sig Dispense Refill  . acetaminophen (TYLENOL) 500 MG tablet Take 1,000 mg by mouth 2 (two) times daily as needed for fever (pain).     . cetirizine (ZYRTEC) 10 MG tablet Take 10 mg by mouth daily.    . hydrochlorothiazide (MICROZIDE) 12.5 MG capsule TAKE ONE CAPSULE BY MOUTH DAILY AS NEEDED FOR LEG SWELLING. 90 capsule 1  . Multiple Vitamin (MULTIVITAMIN WITH MINERALS) TABS tablet Take 1 tablet by mouth daily.    . polyethylene glycol powder (GLYCOLAX/MIRALAX) powder Take 4 g by mouth daily as needed for moderate constipation. 500 g 0  . azelastine (ASTELIN) 0.1 % nasal spray Place 1 spray into both nostrils 2 (two) times daily. Use in  each nostril as directed 30 mL 3  . hydrochlorothiazide (MICROZIDE) 12.5 MG capsule TAKE ONE CAPSULE BY MOUTH DAILY AS NEEDED FOR LEG SWELLING. 90 capsule 1  . levofloxacin (LEVAQUIN) 500 MG tablet Take 1 tablet (500 mg total) by mouth daily. 10 tablet 0   No facility-administered medications prior to visit.      Per HPI unless specifically indicated in ROS section below Review of Systems     Objective:    BP 120/74 (BP Location: Left Arm, Patient Position: Sitting, Cuff Size: Normal)   Pulse 69   Temp 97.6 F (36.4 C) (Oral)   Wt 139 lb 8 oz (63.3 kg)   SpO2 99%   BMI 30.19 kg/m   Wt Readings from Last 3 Encounters:  11/22/16 139 lb 8 oz (63.3 kg)  05/27/16 139 lb 12 oz (63.4 kg)  05/24/16 141 lb (64 kg)    Physical Exam  Constitutional: She appears well-developed and well-nourished. No distress.  HENT:  Mouth/Throat: Oropharynx is clear and moist. No oropharyngeal exudate.  Cardiovascular: Normal rate, regular rhythm, normal heart sounds and intact distal pulses.   No murmur heard. Pulmonary/Chest: Effort normal and breath sounds normal. No respiratory distress. She has no wheezes. She has no rales.  Coarse crackles bibasilarly  Musculoskeletal: She exhibits no edema.  Skin: Skin is warm and dry. No rash noted.  Psychiatric: She has a normal mood and affect.  Nursing note and vitals reviewed.     Assessment & Plan:   Problem List Items Addressed This Visit    Cough - Primary    1 wk h/o cough - she has had pneumonia yearly for the last 3 years. Today not consistent with bacterial infection. ?mild CHF exacerbation vs viral respiratory infection. rec supportive care with OTC cough syrup robitussin. Will also trial stronger diuretic (furosemide 10mg  QD PRN dyspnea or swelling). Discussed using either HCTZ or lasix PRN.  DIL agrees with plan.        Other Visit Diagnoses    Need for influenza vaccination       Relevant Orders   Flu Vaccine QUAD 6+ mos PF IM (Fluarix  Quad PF) (Completed)       Follow up plan: No Follow-up on file.  [name], MD

## 2016-11-22 NOTE — Assessment & Plan Note (Addendum)
1 wk h/o cough - she has had pneumonia yearly for the last 3 years. Today not consistent with bacterial infection. ?mild CHF exacerbation vs viral respiratory infection. rec supportive care with OTC cough syrup robitussin. Will also trial stronger diuretic (furosemide 10mg  QD PRN dyspnea or swelling). Discussed using either HCTZ or lasix PRN.  DIL agrees with plan.

## 2016-11-22 NOTE — Patient Instructions (Addendum)
I don't think there's pneumonia.  Try robitussin for cough.  Watch for fever >101 or worsening productive cough and let us know if this happens.  May try furosemide 10mg  as needed for shortness of breath or swelling.

## 2016-11-23 ENCOUNTER — Telehealth: Payer: Self-pay | Admitting: Family Medicine

## 2016-11-23 ENCOUNTER — Encounter: Payer: Self-pay | Admitting: Family Medicine

## 2016-11-23 NOTE — Telephone Encounter (Signed)
Agree. Ok to stop medicare wellness visits. rec f/u PRN, or yearly for check up. F/u sooner of cough not improving, update [place] with lasix effect.

## 2016-11-23 NOTE — Telephone Encounter (Signed)
Patient's daughter-in-law,[name],called.  She wants to know if Dr.G feels patient still needs to do an AWV.  She said patient is unable to answer questions and she's 89.  Can she just continue to do follow up appointments?

## 2016-11-23 NOTE — Telephone Encounter (Signed)
[name] notified of Dr.G's comments.  [name] said patient started the Lasix this morning and will keep Dr.G updated.

## 2016-11-25 ENCOUNTER — Telehealth: Payer: Self-pay

## 2016-11-25 NOTE — Telephone Encounter (Signed)
[name] left v/m (DPR signed); pt has been taking lasix 20 mg taking 1/2 tab for 3 days; swelling in abd has decreased and [name] wants to know if pt could take Lasix 20 mg 1/2 tab daily instead of prn. [name] request cb.

## 2016-11-25 NOTE — Telephone Encounter (Signed)
Great to hear - let's stop hydrochlorothazide. I would suggest we try lasix 20mg  1/2 tab on Monday, Wednesday, Friday (3 days a week) and monitor for effect.

## 2016-11-25 NOTE — Telephone Encounter (Signed)
Spoke with pt's daughter-in-law, [name], per dpr. Relayed instructions per Dr. Darnell Level. She says ok.

## 2017-04-25 ENCOUNTER — Other Ambulatory Visit: Payer: Self-pay | Admitting: Family Medicine

## 2017-05-04 ENCOUNTER — Ambulatory Visit (INDEPENDENT_AMBULATORY_CARE_PROVIDER_SITE_OTHER): Payer: PPO | Admitting: Family Medicine

## 2017-05-04 ENCOUNTER — Encounter: Payer: Self-pay | Admitting: Family Medicine

## 2017-05-04 VITALS — BP 124/86 | HR 80 | Temp 97.8°F | Wt 142.0 lb

## 2017-05-04 DIAGNOSIS — J22 Unspecified acute lower respiratory infection: Secondary | ICD-10-CM | POA: Insufficient documentation

## 2017-05-04 DIAGNOSIS — F0390 Unspecified dementia without behavioral disturbance: Secondary | ICD-10-CM

## 2017-05-04 MED ORDER — AZITHROMYCIN 250 MG PO TABS: ORAL_TABLET | ORAL | 0 refills | Status: DC

## 2017-05-04 MED ORDER — ALBUTEROL SULFATE HFA 108 (90 BASE) MCG/ACT IN AERS: 2.0000 | INHALATION_SPRAY | Freq: Four times a day (QID) | RESPIRATORY_TRACT | 2 refills | Status: AC | PRN

## 2017-05-04 NOTE — Progress Notes (Signed)
BP 124/86 (BP Location: Left Arm, Patient Position: Sitting, Cuff Size: Normal)   Pulse 80   Temp 97.8 F (36.6 C) (Oral)   Wt 142 lb (64.4 kg)   SpO2 93%   BMI 30.73 kg/m    CC: wheezing, cough Subjective:    Patient ID: Rhonda Foley, female    DOB: 05-Aug-1916, 84 y.o.   MRN: [id number]  HPI: Rhonda Foley is a 84 y.o. female presenting on 05/04/2017 for Wheezing (Started 05/01/17. Used an old inhaler rx. Not sure of name. Pt accompained by daughter-in-law, [name].); Cough; Edema (Abd swelling. [name] is concerned. Has given pt Lasix 3 days this week.); and Altered Mental Status (Per daughter-in-law, pt's mental status is declining. Says pt is less aware of her surroundings/location.)   [name], DIL was out of the country for the last 2 weeks.  24/7 - 3 caregivers.   Jilene has wheezing and nonproductive cough that started for last 3 days. Wheezing worse on Sunday.   No fevers/chills, abd pain, ST or headache.  No smokers at home.  + sick contacts at home.   DIL notes worsening unsteadiness/gait. Shuffling gait. No falls. Increased appetite.  Takes miralax 1 capful daily. Normal stools 1-2/day.    Relevant past medical, surgical, family and social history reviewed and updated as indicated. Interim medical history since our last visit reviewed. Allergies and medications reviewed and updated. Outpatient Medications Prior to Visit  Medication Sig Dispense Refill  . acetaminophen (TYLENOL) 500 MG tablet Take 1,000 mg by mouth 2 (two) times daily as needed for fever (pain).     . cetirizine (ZYRTEC) 10 MG tablet Take 10 mg by mouth daily.    Marland Kitchen docusate sodium (COLACE) 100 MG capsule Take 100 mg by mouth daily as needed for mild constipation.    . furosemide (LASIX) 20 MG tablet TAKE 0.5 TABLETS (10 MG TOTAL) BY MOUTH DAILY AS NEEDED FOR EDEMA (DYSPNEA). 30 tablet 1  . Multiple Vitamin (MULTIVITAMIN WITH MINERALS) TABS tablet Take 1 tablet by mouth daily.    . polyethylene glycol  powder (GLYCOLAX/MIRALAX) powder Take 4 g by mouth daily as needed for moderate constipation. 500 g 0   No facility-administered medications prior to visit.      Per HPI unless specifically indicated in ROS section below Review of Systems     Objective:    BP 124/86 (BP Location: Left Arm, Patient Position: Sitting, Cuff Size: Normal)   Pulse 80   Temp 97.8 F (36.6 C) (Oral)   Wt 142 lb (64.4 kg)   SpO2 93%   BMI 30.73 kg/m   Wt Readings from Last 3 Encounters:  05/04/17 142 lb (64.4 kg)  11/22/16 139 lb 8 oz (63.3 kg)  05/27/16 139 lb 12 oz (63.4 kg)    Physical Exam  Constitutional: She appears well-developed and well-nourished. No distress.  HENT:  Head: Normocephalic and atraumatic.  Right Ear: External ear normal. Decreased hearing is noted.  Left Ear: External ear normal. Decreased hearing is noted.  Nose: No mucosal edema or rhinorrhea.  Mouth/Throat: Uvula is midline, oropharynx is clear and moist and mucous membranes are normal. No oropharyngeal exudate, posterior oropharyngeal edema, posterior oropharyngeal erythema or tonsillar abscesses.  Wears hearing aides  Eyes: Conjunctivae and EOM are normal. Pupils are equal, round, and reactive to light. No scleral icterus.  Neck: Normal range of motion. Neck supple.  Cardiovascular: Normal rate, regular rhythm, normal heart sounds and intact distal pulses.  No murmur heard. Pulmonary/Chest:  Effort normal. No respiratory distress. She has decreased breath sounds (crackles throughout). She has no wheezes. She has no rales.  Abdominal: Soft. Normal appearance and bowel sounds are normal. She exhibits no distension and no mass. There is no hepatosplenomegaly. There is no tenderness. There is no rebound, no guarding and no CVA tenderness.  Musculoskeletal: She exhibits no edema.  Lymphadenopathy:    She has no cervical adenopathy.  Skin: Skin is warm and dry. No rash noted.  Nursing note and vitals reviewed.       Assessment & Plan:   Problem List Items Addressed This Visit    Acute respiratory infection - Primary    With mildly decreased o2 sat (93%). Cover with zpack for bacterial or atypical infection given age. Further supportive care reviewed. H/o lung scarring from either bronchiectasis or atypical lung infection by prior imaging.  Albuterol prn dyspnea. DIL states this has helped.       Relevant Medications   azithromycin (ZITHROMAX) 250 MG tablet   Senile dementia    Progressive decline, no acute deterioration.          Meds ordered this encounter  Medications  . albuterol (PROVENTIL HFA;VENTOLIN HFA) 108 (90 Base) MCG/ACT inhaler    Sig: Inhale 2 puffs into the lungs every 6 (six) hours as needed for wheezing or shortness of breath.    Dispense:  1 Inhaler    Refill:  2  . azithromycin (ZITHROMAX) 250 MG tablet    Sig: Take two tablets on day one followed by one tablet on days 2-5    Dispense:  6 each    Refill:  0   No orders of the defined types were placed in this encounter.   Follow up plan: Return if symptoms worsen or fail to improve.  [name], MD

## 2017-05-04 NOTE — Assessment & Plan Note (Addendum)
Progressive decline, no acute deterioration.

## 2017-05-04 NOTE — Assessment & Plan Note (Addendum)
With mildly decreased o2 sat (93%). Cover with zpack for bacterial or atypical infection given age. Further supportive care reviewed. H/o lung scarring from either bronchiectasis or atypical lung infection by prior imaging.  Albuterol prn dyspnea. DIL states this has helped.

## 2017-05-04 NOTE — Patient Instructions (Addendum)
I think Mrs Scheier has a respiratory infection.  Will cover with zpack antibiotic.  May take albuterol inhaler as needed.  Continue pushing fluids and rest. Let us know if not improving with treatment.

## 2017-05-30 ENCOUNTER — Ambulatory Visit: Payer: Self-pay | Admitting: *Deleted

## 2017-05-30 ENCOUNTER — Ambulatory Visit (INDEPENDENT_AMBULATORY_CARE_PROVIDER_SITE_OTHER): Payer: PPO | Admitting: Family Medicine

## 2017-05-30 ENCOUNTER — Encounter: Payer: Self-pay | Admitting: Family Medicine

## 2017-05-30 ENCOUNTER — Ambulatory Visit (INDEPENDENT_AMBULATORY_CARE_PROVIDER_SITE_OTHER)
Admission: RE | Admit: 2017-05-30 | Discharge: 2017-05-30 | Disposition: A | Discharge status: Home / Self Care | Payer: PPO | Source: Ambulatory Visit | Attending: Family Medicine | Admitting: Family Medicine

## 2017-05-30 VITALS — BP 124/74 | HR 61 | Temp 97.8°F | Wt 143.0 lb

## 2017-05-30 DIAGNOSIS — R918 Other nonspecific abnormal finding of lung field: Secondary | ICD-10-CM

## 2017-05-30 DIAGNOSIS — I517 Cardiomegaly: Secondary | ICD-10-CM

## 2017-05-30 DIAGNOSIS — R0609 Other forms of dyspnea: Secondary | ICD-10-CM

## 2017-05-30 DIAGNOSIS — F0390 Unspecified dementia without behavioral disturbance: Secondary | ICD-10-CM

## 2017-05-30 DIAGNOSIS — R0600 Dyspnea, unspecified: Secondary | ICD-10-CM | POA: Diagnosis not present

## 2017-05-30 NOTE — Telephone Encounter (Signed)
Pt's daughter called because her mom is having more shortness of breath. She has the shortness of breath when she is just sitting and moving around. The daughter denies any swelling in her lower extremities. She does not have any difficulty with her head down and sleeps on one pillow. Her daughter states her mom does not c/o about a lot. She denies fever. She is not with her mom right now so can not check her pulse. Appointment made for today with her pcp.  Reason for Disposition . [1] MODERATE longstanding difficulty breathing (e.g., speaks in phrases, SOB even at rest, pulse 100-120) AND [2] SAME as normal  Answer Assessment - Initial Assessment Questions 1. RESPIRATORY STATUS: "Describe your breathing?" (e.g., wheezing, shortness of breath, unable to speak, severe coughing)      Shortness of breath 2. ONSET: "When did this breathing problem begin?"      On going 3. PATTERN "Does the difficult breathing come and go, or has it been constant since it started?"      constant 4. SEVERITY: "How bad is your breathing?" (e.g., mild, moderate, severe)    - MILD: No SOB at rest, mild SOB with walking, speaks normally in sentences, can lay down, no retractions, pulse < 100.    - MODERATE: SOB at rest, SOB with minimal exertion and prefers to sit, cannot lie down flat, speaks in phrases, mild retractions, audible wheezing, pulse 100-120.    - SEVERE: Very SOB at rest, speaks in single words, struggling to breathe, sitting hunched forward, retractions, pulse > 120      Mild when sitting and occ can have her wheezing and severe with exertion 5. RECURRENT SYMPTOM: "Have you had difficulty breathing before?" If so, ask: "When was the last time?" and "What happened that time?"      On going 6. CARDIAC HISTORY: "Do you have any history of heart disease?" (e.g., heart attack, angina, bypass surgery, angioplasty)      no 7. LUNG HISTORY: "Do you have any history of lung disease?"  (e.g., pulmonary embolus,  asthma, emphysema)     A little bit of scarring in her lungs, had pneumonia a few years ago 46. CAUSE: "What do you think is causing the breathing problem?"      Not sure 9. OTHER SYMPTOMS: "Do you have any other symptoms? (e.g., dizziness, runny nose, cough, chest pain, fever)     Cough 10. PREGNANCY: "Is there any chance you are pregnant?" "When was your last menstrual period?"       no 11. TRAVEL: "Have you traveled out of the country in the last month?" (e.g., travel history, exposures)       no  Protocols used: BREATHING DIFFICULTY-A-AH

## 2017-05-30 NOTE — Progress Notes (Signed)
BP 124/74 (BP Location: Left Arm, Patient Position: Sitting, Cuff Size: Normal)   Pulse 61   Temp 97.8 F (36.6 C) (Oral)   Wt 143 lb (64.9 kg)   SpO2 96%   BMI 30.94 kg/m    CC: dyspnea/wheezing Subjective:    Patient ID: Rhonda Foley, female    DOB: 18-Feb-1917, 84 y.o.   MRN: [id number]  HPI: Rhonda Foley is a 84 y.o. female presenting on 05/30/2017 for Shortness of Breath (Per daughter-in-law, [name], pt is having increased shortness of breath with exertion. ) and Wheezing   Here with [name]. Story limited by dementia.  Over the weekend noticing increasing dyspnea with exertion. Mild cough. Mild worsening dizziness and unsteadiness. Generalized weakness noted. Mild weight gain noted.   No fevers/chills, no worsening cough, no leg swelling. No chest pain noted.  They have tried increasing lasix to daily with mild effect.   Seen last month with acute respiratory infection, treated with zpack antibiotic.  H/o lung scarring from either bronchiectasis or atypical lung infection on prior imaging.  She takes albuterol PRN dyspnea/wheeze.   Relevant past medical, surgical, family and social history reviewed and updated as indicated. Interim medical history since our last visit reviewed. Allergies and medications reviewed and updated. Outpatient Medications Prior to Visit  Medication Sig Dispense Refill  . acetaminophen (TYLENOL) 500 MG tablet Take 1,000 mg by mouth 2 (two) times daily as needed for fever (pain).     Marland Kitchen albuterol (PROVENTIL HFA;VENTOLIN HFA) 108 (90 Base) MCG/ACT inhaler Inhale 2 puffs into the lungs every 6 (six) hours as needed for wheezing or shortness of breath. 1 Inhaler 2  . azithromycin (ZITHROMAX) 250 MG tablet Take two tablets on day one followed by one tablet on days 2-5 6 each 0  . cetirizine (ZYRTEC) 10 MG tablet Take 10 mg by mouth daily.    Marland Kitchen docusate sodium (COLACE) 100 MG capsule Take 100 mg by mouth daily as needed for mild constipation.    .  furosemide (LASIX) 20 MG tablet TAKE 0.5 TABLETS (10 MG TOTAL) BY MOUTH DAILY AS NEEDED FOR EDEMA (DYSPNEA). 30 tablet 1  . Multiple Vitamin (MULTIVITAMIN WITH MINERALS) TABS tablet Take 1 tablet by mouth daily.    . polyethylene glycol powder (GLYCOLAX/MIRALAX) powder Take 4 g by mouth daily as needed for moderate constipation. 500 g 0   No facility-administered medications prior to visit.      Per HPI unless specifically indicated in ROS section below Review of Systems     Objective:    BP 124/74 (BP Location: Left Arm, Patient Position: Sitting, Cuff Size: Normal)   Pulse 61   Temp 97.8 F (36.6 C) (Oral)   Wt 143 lb (64.9 kg)   SpO2 96%   BMI 30.94 kg/m   Wt Readings from Last 3 Encounters:  05/30/17 143 lb (64.9 kg)  05/04/17 142 lb (64.4 kg)  11/22/16 139 lb 8 oz (63.3 kg)    Physical Exam  Constitutional: She appears well-developed and well-nourished. No distress.  HENT:  Head: Normocephalic and atraumatic.  Mouth/Throat: Oropharynx is clear and moist. No oropharyngeal exudate.  Eyes: Pupils are equal, round, and reactive to light. Conjunctivae are normal.  Neck: Normal range of motion. Neck supple.  Cardiovascular: Normal rate, regular rhythm and intact distal pulses.  Murmur (systolic) heard. Pulmonary/Chest: Effort normal. No respiratory distress. She has no wheezes. She has no rales.  Coarse crackles throughout  Musculoskeletal: She exhibits no edema.  Skin:  Skin is warm and dry. No rash noted.  Psychiatric: She has a normal mood and affect.  Nursing note and vitals reviewed.  Results for orders placed or performed in visit on 96/28/36  Basic metabolic panel  Result Value Ref Range   Sodium 140 135 - 145 mEq/L   Potassium 3.6 3.5 - 5.1 mEq/L   Chloride 100 96 - 112 mEq/L   CO2 32 19 - 32 mEq/L   Glucose, Bld 102 (H) 70 - 99 mg/dL   BUN 22 6 - 23 mg/dL   Creatinine, Ser 0.71 0.40 - 1.20 mg/dL   Calcium 10.0 8.4 - 10.5 mg/dL   GFR 80.81 >60.00 mL/min    CBC with Differential/Platelet  Result Value Ref Range   WBC 7.8 4.0 - 10.5 K/uL   RBC 4.64 3.87 - 5.11 Mil/uL   Hemoglobin 13.7 12.0 - 15.0 g/dL   HCT 41.2 36.0 - 46.0 %   MCV 88.6 78.0 - 100.0 fl   MCHC 33.3 30.0 - 36.0 g/dL   RDW 14.1 11.5 - 15.5 %   Platelets 368.0 150.0 - 400.0 K/uL   Neutrophils Relative % 51.4 43.0 - 77.0 %   Lymphocytes Relative 31.5 12.0 - 46.0 %   Monocytes Relative 13.3 (H) 3.0 - 12.0 %   Eosinophils Relative 2.9 0.0 - 5.0 %   Basophils Relative 0.9 0.0 - 3.0 %   Neutro Abs 4.0 1.4 - 7.7 K/uL   Lymphs Abs 2.5 0.7 - 4.0 K/uL   Monocytes Absolute 1.0 0.1 - 1.0 K/uL   Eosinophils Absolute 0.2 0.0 - 0.7 K/uL   Basophils Absolute 0.1 0.0 - 0.1 K/uL  H. pylori antibody, IgG  Result Value Ref Range   H Pylori IgG Negative Negative  Hepatic function panel  Result Value Ref Range   Total Bilirubin 0.2 0.2 - 1.2 mg/dL   Bilirubin, Direct 0.0 0.0 - 0.3 mg/dL   Alkaline Phosphatase 63 39 - 117 U/L   AST 18 0 - 37 U/L   ALT 11 0 - 35 U/L   Total Protein 7.0 6.0 - 8.3 g/dL   Albumin 3.9 3.5 - 5.2 g/dL  Lipase  Result Value Ref Range   Lipase 23.0 11.0 - 59.0 U/L  Amylase  Result Value Ref Range   Amylase 40 27 - 131 U/L   DG Chest 2 View CLINICAL DATA:  Dyspnea  EXAM: CHEST - 2 VIEW  COMPARISON:  08/28/2015  FINDINGS: The heart remains markedly enlarged. Normal vascularity. Hyperaeration. Interstitial prominence. Bronchitic changes. No pneumothorax. No pleural effusion.  IMPRESSION: No active cardiopulmonary disease.  Electronically Signed   By: Marybelle Killings M.D.   On: 05/30/2017 17:20   Ambulatory pulse ox - maintains O2 sats above 94%    Assessment & Plan:   Problem List Items Addressed This Visit    Abnormal CT scan, lung   Cardiomegaly    Encouraged daily weights.       Dyspnea - Primary    Ongoing, worse over last few days. Likely multifactorial. Chronic scarring of lungs along with know cardiomegaly. Will continue albuterol,  increase lasix to 10mg  daily for 2 wks and I asked them to update me with effect. If ongoing, low threshold for labwork (CBC, BNP, etc). DIL agrees with plan.  Ambulatory pulse ox today - stable. CXR today - clear on my read.       Relevant Orders   DG Chest 2 View (Completed)   Senile dementia  No orders of the defined types were placed in this encounter.  Orders Placed This Encounter  Procedures  . DG Chest 2 View    Standing Status:   Future    Number of Occurrences:   1    Standing Expiration Date:   07/31/2018    Order Specific Question:   Reason for Exam (SYMPTOM  OR DIAGNOSIS REQUIRED)    Answer:   exertional dyspnea    Order Specific Question:   Preferred imaging location?    Answer:   [facility]    Order Specific Question:   Radiology Contrast Protocol - do NOT remove file path    Answer:   [email or web address]    Follow up plan: Return if symptoms worsen or fail to improve.  [name], MD

## 2017-05-30 NOTE — Patient Instructions (Addendum)
Start daily weights.  Start lasix 1/2 tablet daily for 1-2 weeks and update me with effect - monitor frequency of urination on lasix.  May continue albuterol as needed for wheezing.  If ongoing trouble with shortness of breath, let me know for next step labs and possible CT scan.

## 2017-05-30 NOTE — Telephone Encounter (Signed)
Daughter also stated that she had increase he mom's lasix to see it that would help her shortness of breath.

## 2017-05-30 NOTE — Telephone Encounter (Signed)
I spoke with [name] (DPR signed) and she said pt can wait until this afternoon to be seen; if pt condition worsens prior to appt [name] will cb.

## 2017-05-31 NOTE — Assessment & Plan Note (Signed)
Encouraged daily weights.

## 2017-05-31 NOTE — Assessment & Plan Note (Signed)
Ongoing, worse over last few days. Likely multifactorial. Chronic scarring of lungs along with know cardiomegaly. Will continue albuterol, increase lasix to 10mg  daily for 2 wks and I asked them to update me with effect. If ongoing, low threshold for labwork (CBC, BNP, etc). DIL agrees with plan.  Ambulatory pulse ox today - stable. CXR today - clear on my read.

## 2017-06-10 IMAGING — CT CT HEAD W/O CM
4 of 9 series · 16 of 47 positions shown, 18 images · non-contrast
Comparison: CT head and maxillofacial exams of 04/29/2014

CLINICAL DATA: Tripped on a rug and fell face first onto concrete
floor, off LEFT frontal laceration, swelling and bruising to nose,
dementia, hypertension, initial encounter

EXAM:
CT HEAD WITHOUT CONTRAST
CT MAXILLOFACIAL WITHOUT CONTRAST
CT CERVICAL SPINE WITHOUT CONTRAST
TECHNIQUE: Multidetector CT imaging of the head, cervical spine, and
maxillofacial structures were performed using the standard protocol
without intravenous contrast. Multiplanar CT image reconstructions
of the cervical spine and maxillofacial structures were also
generated. Right side of face marked with BB.

[Series 3: max soft · axial · 0.31mm/px · z∈[-214,-114]mm · 5 of 76 slices shown]
[im 13/76  brain]
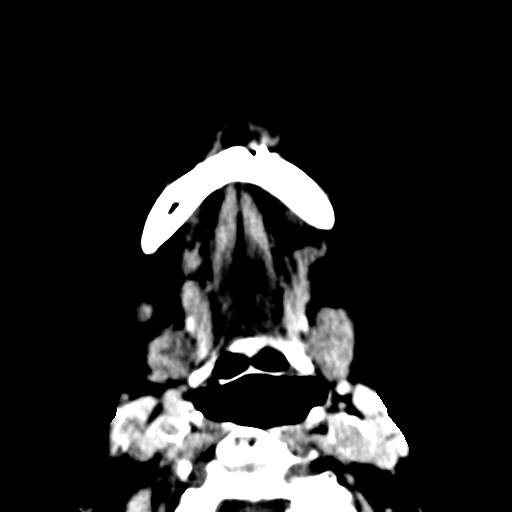
[im 26/76  brain]
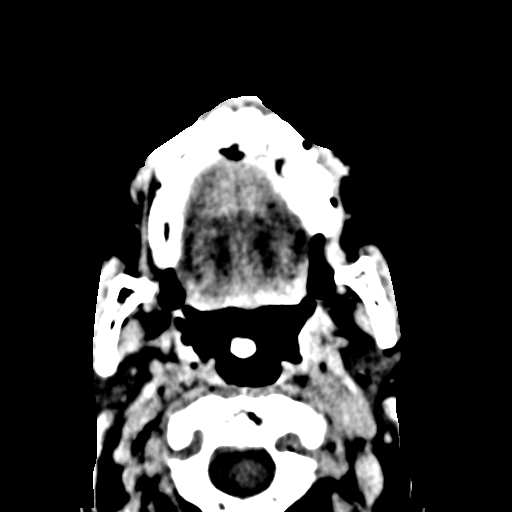
[im 38/76  brain]
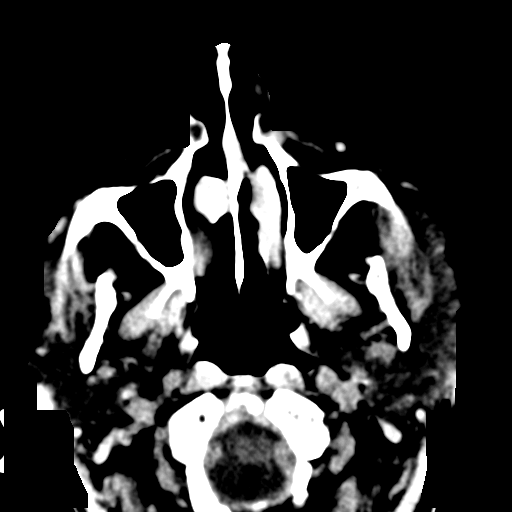
[im 51/76  brain]
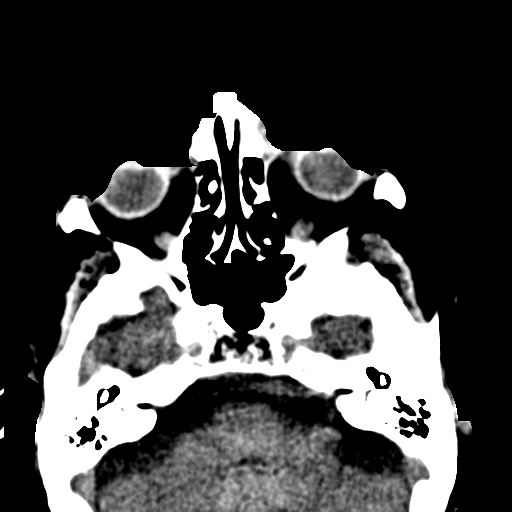
[im 63/76  brain]
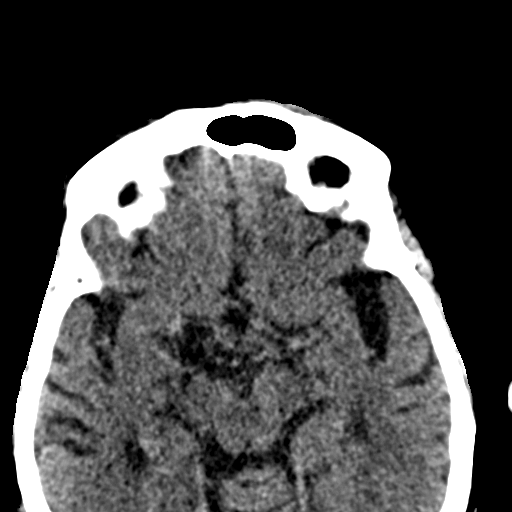

[Series 5: coronal soft · coronal · 0.31mm/px · 3 of 78 slices shown]
[im 27/78  brain]
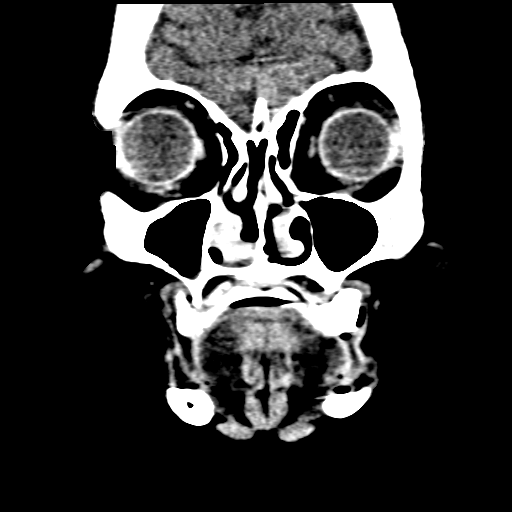
[im 40/78  brain]
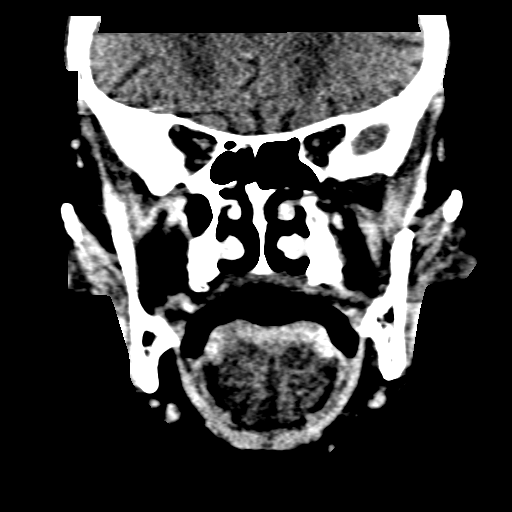
[im 53/78  brain]
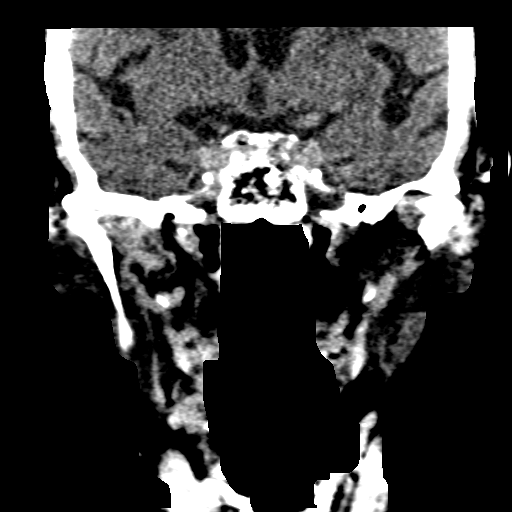

[Series 6: sagittal soft · sagittal · 0.31mm/px · 2 of 69 slices shown]
[im 23/69  brain]
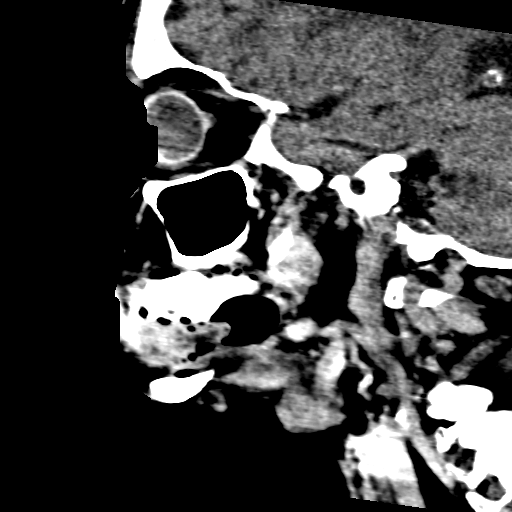
[im 46/69  brain]
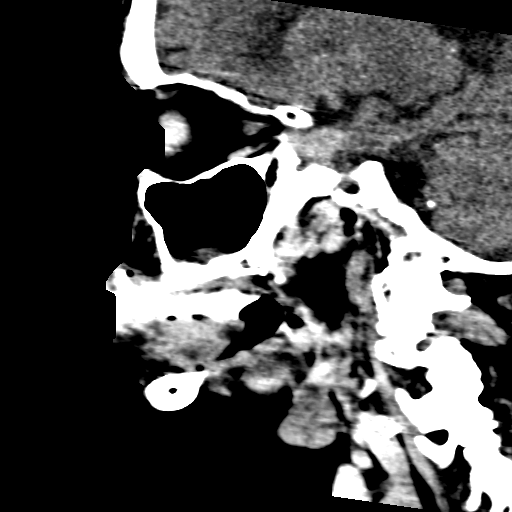

[Series 14: axial · axial · 0.18mm/px · z∈[-291,-188]mm · 6 of 85 slices shown, 8 images]
[im 13/85  brain]
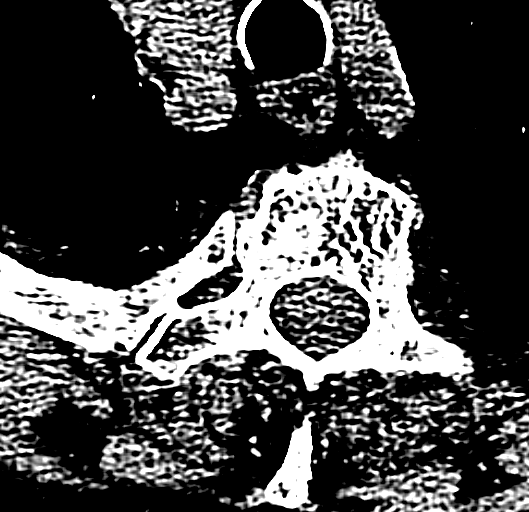
[im 13/85  bone]
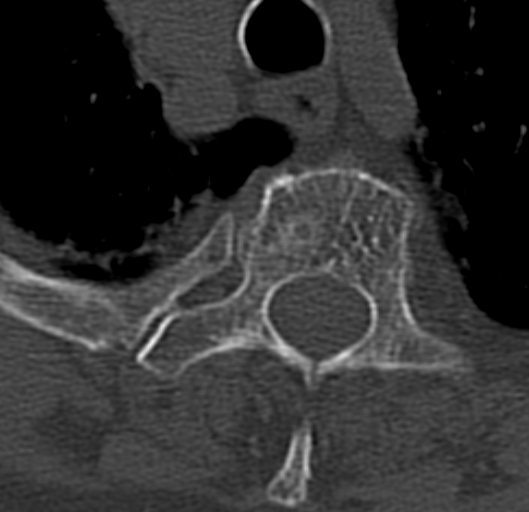
[im 25/85  brain]
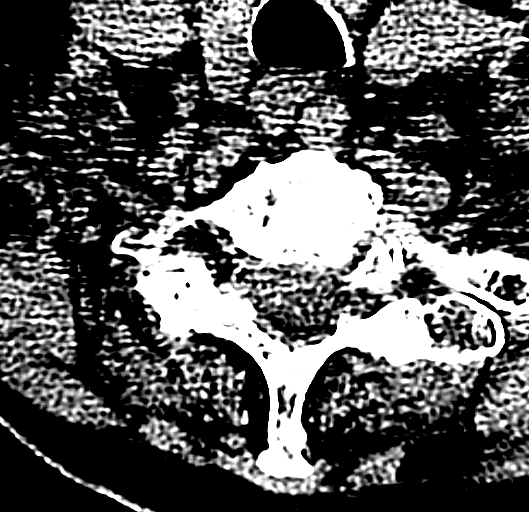
[im 37/85  brain]
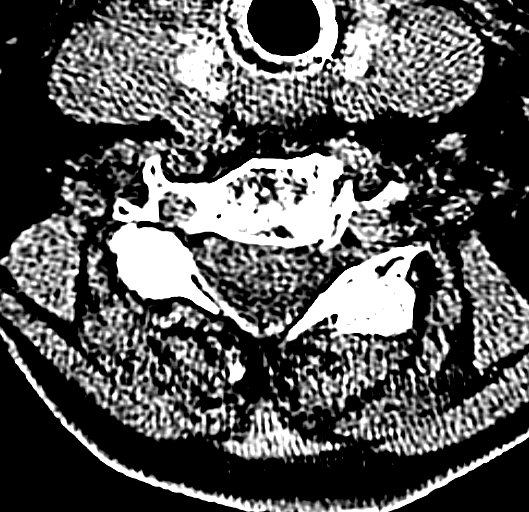
[im 49/85  brain]
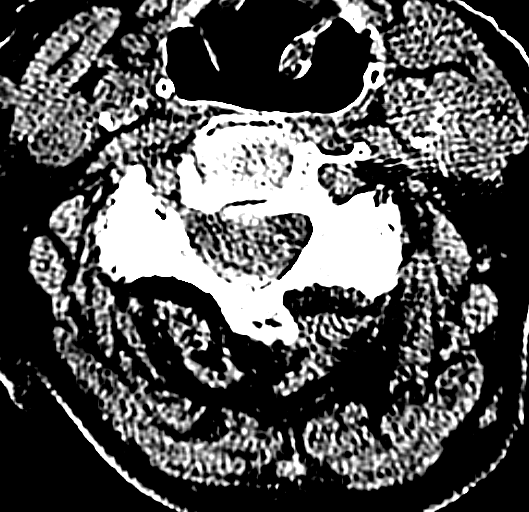
[im 61/85  brain]
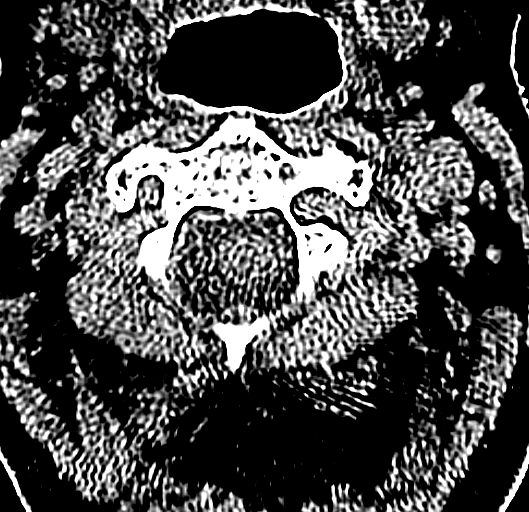
[im 61/85  bone]
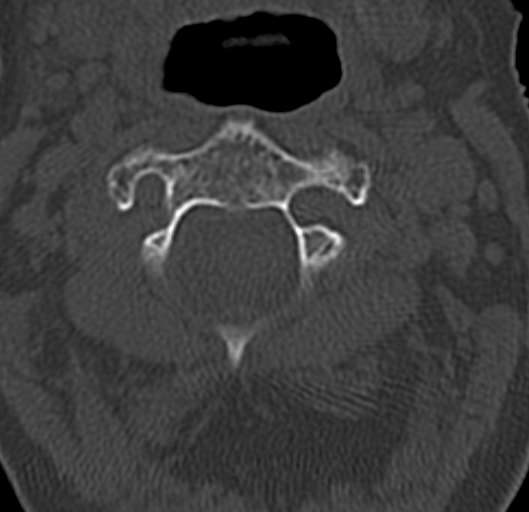
[im 73/85  brain]
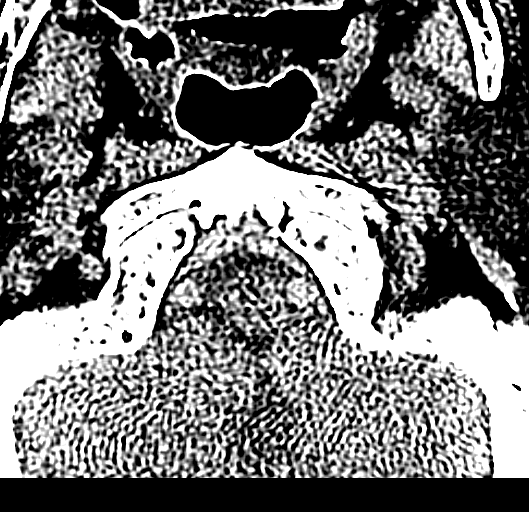

[16 of 47 positions shown; findings below may reference images not displayed]

FINDINGS: CT HEAD FINDINGS

Generalized atrophy.

Normal ventricular morphology.

No midline shift or mass effect.

Small vessel chronic ischemic changes of deep cerebral white matter.

No intracranial hemorrhage or evidence acute infarction.

Calcified nodule adjacent to the RIGHT lateral aspect of the
posterior falx 10 mm diameter question small calcified meningioma.

No other intracranial mass lesions.

Bones demineralized without fracture.

LEFT frontal scalp soft tissue swelling with few foci of gas from
known laceration.

CT MAXILLOFACIAL FINDINGS

Visualized intracranial structures as noted above.

Small LEFT supraorbital scalp hematoma with tiny foci of gas
consistent with laceration.

Minimally displaced RIGHT nasal bone fracture.

BILATERAL nasal soft tissue swelling.

Bones demineralized.

Question polyp within RIGHT nasal passage 17 x 12 mm image 40.

Patient edentulous.

Mucosal thickening and small echoes retention cyst in RIGHT
maxillary sinus.

Remaining paranasal sinuses, mastoid air cells and middle ear
cavities clear bilaterally.

No additional facial bone fractures identified.

CT CERVICAL SPINE FINDINGS

Diffuse osseous demineralization.

Disc space narrowing with bulky endplate spur formation anteriorly
at C6-C7.

Mild disc space narrowing and endplate spur formation at C4-C5 and
C5-C6.

Minimal anterior height loss of T1 vertebral body, without acute
fracture plane, likely old.

Remaining vertebral body heights maintained.

Visualized skullbase intact.

No additional fracture, subluxation or bone destruction.

Multilevel facet degenerative changes of the cervical spine.
IMPRESSION: Atrophy with small vessel chronic ischemic changes of deep cerebral
white matter.

No acute intracranial abnormalities.

Questionable 10 mm calcified meningioma at RIGHT lateral aspect of
falx versus dural calcification.

Tiny distal RIGHT nasal bone fracture.

Question polyp within RIGHT nasal passages 17 x 12 mm.

Degenerative disc and facet disease changes of the cervical spine
with suspected old S minimal superior endplate compression deformity
of T1 vertebral body.

No definite acute cervical spine abnormalities.

## 2017-06-18 ENCOUNTER — Other Ambulatory Visit: Payer: Self-pay | Admitting: Family Medicine

## 2017-06-20 NOTE — Telephone Encounter (Signed)
Electronic refill request Last office visit 05/30/17 See office note Confirm directions

## 2017-08-07 ENCOUNTER — Encounter: Payer: Self-pay | Admitting: Emergency Medicine

## 2017-08-07 ENCOUNTER — Emergency Department
Admission: EM | Admit: 2017-08-07 | Discharge: 2017-08-07 | Disposition: A | Discharge status: Home / Self Care | Payer: PPO | Attending: Emergency Medicine | Admitting: Emergency Medicine

## 2017-08-07 ENCOUNTER — Other Ambulatory Visit: Payer: Self-pay

## 2017-08-07 ENCOUNTER — Emergency Department: Payer: PPO

## 2017-08-07 DIAGNOSIS — F0390 Unspecified dementia without behavioral disturbance: Secondary | ICD-10-CM | POA: Insufficient documentation

## 2017-08-07 DIAGNOSIS — I10 Essential (primary) hypertension: Secondary | ICD-10-CM | POA: Diagnosis not present

## 2017-08-07 DIAGNOSIS — Z79899 Other long term (current) drug therapy: Secondary | ICD-10-CM | POA: Diagnosis not present

## 2017-08-07 DIAGNOSIS — R4182 Altered mental status, unspecified: Secondary | ICD-10-CM | POA: Diagnosis not present

## 2017-08-07 DIAGNOSIS — J449 Chronic obstructive pulmonary disease, unspecified: Secondary | ICD-10-CM | POA: Diagnosis not present

## 2017-08-07 DIAGNOSIS — R05 Cough: Secondary | ICD-10-CM | POA: Diagnosis not present

## 2017-08-07 DIAGNOSIS — N390 Urinary tract infection, site not specified: Secondary | ICD-10-CM | POA: Insufficient documentation

## 2017-08-07 LAB — CBC WITH DIFFERENTIAL/PLATELET
BASOS ABS: 0.1 10*3/uL (ref 0–0.1)
Basophils Relative: 1 %
EOS ABS: 0.4 10*3/uL (ref 0–0.7)
Eosinophils Relative: 4 %
HEMATOCRIT: 40.4 % (ref 35.0–47.0)
HEMOGLOBIN: 13.6 g/dL (ref 12.0–16.0)
Lymphocytes Relative: 14 %
Lymphs Abs: 1.4 10*3/uL (ref 1.0–3.6)
MCH: 30.2 pg (ref 26.0–34.0)
MCHC: 33.6 g/dL (ref 32.0–36.0)
MCV: 89.9 fL (ref 80.0–100.0)
MONO ABS: 1.3 10*3/uL — AB (ref 0.2–0.9)
MONOS PCT: 13 %
NEUTROS ABS: 7 10*3/uL — AB (ref 1.4–6.5)
NEUTROS PCT: 70 %
Platelets: 300 10*3/uL (ref 150–440)
RBC: 4.5 MIL/uL (ref 3.80–5.20)
RDW: 14.3 % (ref 11.5–14.5)
WBC: 10.1 10*3/uL (ref 3.6–11.0)

## 2017-08-07 LAB — BASIC METABOLIC PANEL
ANION GAP: 5 (ref 5–15)
BUN: 18 mg/dL (ref 6–20)
CALCIUM: 8.7 mg/dL — AB (ref 8.9–10.3)
CO2: 26 mmol/L (ref 22–32)
CREATININE: 0.54 mg/dL (ref 0.44–1.00)
Chloride: 108 mmol/L (ref 101–111)
GFR calc Af Amer: >60 mL/min (ref >60)
GFR calc non Af Amer: >60 mL/min (ref >60)
Glucose, Bld: 137 mg/dL — ABNORMAL HIGH (ref 65–99)
Potassium: 4 mmol/L (ref 3.5–5.1)
SODIUM: 139 mmol/L (ref 135–145)

## 2017-08-07 LAB — URINALYSIS, COMPLETE (UACMP) WITH MICROSCOPIC
Bilirubin Urine: NEGATIVE
Glucose, UA: NEGATIVE mg/dL
HGB URINE DIPSTICK: NEGATIVE
Ketones, ur: NEGATIVE mg/dL
NITRITE: NEGATIVE
PH: 6 (ref 5.0–8.0)
PROTEIN: NEGATIVE mg/dL
SPECIFIC GRAVITY, URINE: 1.019 (ref 1.005–1.030)
WBC, UA: >50 WBC/hpf — ABNORMAL HIGH (ref 0–5)

## 2017-08-07 LAB — TROPONIN I

## 2017-08-07 MED ORDER — CEFTRIAXONE SODIUM 1 G IJ SOLR
1.0000 g | Freq: Once | INTRAMUSCULAR | Status: AC
  Administered 2017-08-07: 1 g via INTRAVENOUS
  Filled 2017-08-07: qty 10

## 2017-08-07 MED ORDER — CEPHALEXIN 500 MG PO CAPS: 500.0000 mg | ORAL_CAPSULE | Freq: Three times a day (TID) | ORAL | 0 refills | Status: DC

## 2017-08-07 NOTE — ED Notes (Signed)
Patient transported to X-ray 

## 2017-08-07 NOTE — Discharge Instructions (Signed)
Please seek medical attention for any high fevers, chest pain, shortness of breath, change in behavior, persistent vomiting, bloody stool or any other new or concerning symptoms.  

## 2017-08-07 NOTE — ED Triage Notes (Signed)
Arrives with daughter in law.  C/O cough since Thursday,, and altered mental status and unstable gait since Friday.  Patient has 24 hour caregivers who report that patient has been unable to put hearing aids in and difficulty putting dentures in.  Family states this is a change from patient's baseline.  Congested cough noted.  No SOB/ DOE.  Skin warm and dry.

## 2017-08-07 NOTE — ED Provider Notes (Signed)
[facility] Emergency Department Provider Note   ____________________________________________   I have reviewed the triage vital signs and the nursing notes.   HISTORY  Chief Complaint Cough and Altered Mental Status   History limited by and level 5 caveat due to: Confusion, history primarily obtained from daughter   HPI Rhonda Foley is a 84 y.o. female who presents to the emergency department today because of concerns for cough and confusion.  Family states that patient has had these symptoms over the past couple of days.  They describe a wet cough.  It was productive of some green phlegm.  Family states the patient has had pneumonias in the past and is gotten sick from them quickly.  The patient has also had increasing confusion over the past couple of days.  They have noticed that she is having a hard time performing ADLs she normally can do okay.  Have not noticed any fevers.  States that her appetite is still good.  She also has a history of urinary tract infections that have caused confusion.   Per medical record review patient has a history of dementia.  Past Medical History:  Diagnosis Date  . Bleeding stomach ulcer 1993; 12/99  . CAP (community acquired pneumonia) 02/24/2015  . Cardiomegaly 04/2013   by CXR  . COPD (chronic obstructive pulmonary disease) ([place]) 04/2013   by CXR  . Dementia 06/2011   MMSE 14/30, thought senile although fmhx alz type; did not respond to aricept trial  . History of UTI    "infrequent"  . HLD (hyperlipidemia) 1991   "not on RX for years" (02/24/2015)  . HTN (hypertension) 1997  . Osteoarthritis   . Osteopenia 04/2013   by CXR  . Short-term memory loss   . Shoulder fracture, right 01/2008   "fell off the porch; put her in a brace; no OR; Dr. Reche Dixon"  . Thoracic compression fracture ([place]) 04/2013   by CXR    Patient Active Problem List   Diagnosis Date Noted  . Chronic rhinitis 02/26/2016  . Right knee  injury 08/28/2015  . Advanced care planning/counseling discussion 03/10/2015  . Abnormal CT scan, lung 03/07/2015  . Lung nodule, solitary 02/24/2015  . Chest discomfort 10/24/2014  . Right knee pain 10/24/2014  . Dyspnea 04/05/2014  . Cardiomegaly 04/05/2014  . Cough 04/09/2013  . Bilateral venous insufficiency 09/01/2011  . Medicare annual wellness visit, initial 03/17/2011  . Senile dementia 03/17/2011  . HYPERLIPIDEMIA 10/19/2006  . Essential hypertension 10/19/2006  . GASTROINTESTINAL HEMORRHAGE, HX OF 10/19/2006    Past Surgical History:  Procedure Laterality Date  . ANKLE FRACTURE SURGERY Right 1993  . CATARACT EXTRACTION W/ INTRAOCULAR LENS  IMPLANT, BILATERAL Bilateral   . FRACTURE SURGERY    . SIGMOIDOSCOPY  06/21/96  . US ECHOCARDIOGRAPHY  04/2014   ER 55-60%, (mod MR, mod TR, mod elevated PA pressures    Prior to Admission medications   Medication Sig Start Date End Date Taking? Authorizing Provider  acetaminophen (TYLENOL) 500 MG tablet Take 1,000 mg by mouth 2 (two) times daily as needed for fever (pain).     [provider]  albuterol (PROVENTIL HFA;VENTOLIN HFA) 108 (90 Base) MCG/ACT inhaler Inhale 2 puffs into the lungs every 6 (six) hours as needed for wheezing or shortness of breath. 05/04/17   [name], MD  azithromycin [facility]) 250 MG tablet Take two tablets on day one followed by one tablet on days 2-5 05/04/17   [name], MD  cetirizine (ZYRTEC) 10 MG tablet Take 10 mg by mouth daily.    [provider]  docusate sodium (COLACE) 100 MG capsule Take 100 mg by mouth daily as needed for mild constipation.    [provider]  furosemide (LASIX) 20 MG tablet Take 0.5 tablets (10 mg total) by mouth daily. 06/20/17   [name], MD  Multiple Vitamin (MULTIVITAMIN WITH MINERALS) TABS tablet Take 1 tablet by mouth daily.    [provider]  polyethylene glycol powder (GLYCOLAX/MIRALAX) powder Take 4 g by mouth  daily as needed for moderate constipation. 03/07/15   [name], MD    Allergies Nsaids  Family History  Problem Relation Age of Onset  . Other Father        stomach problem; terminal stomach pain/"lost his mind"  . Alzheimer's disease Mother   . Cancer Brother   . Cancer Sister   . Dementia Sister   . Hypertension Brother   . Hypertension Sister     Social History Social History   Tobacco Use  . Smoking status: Never Smoker  . Smokeless tobacco: Never Used  Substance Use Topics  . Alcohol use: No    Alcohol/week: 0.0 oz  . Drug use: No    Review of Systems Constitutional: No fever/chills Cardiovascular: Denies chest pain. Respiratory: Positive for cough.  Gastrointestinal: No abdominal pain.  No nausea, no vomiting.  No diarrhea.    ____________________________________________   PHYSICAL EXAM:  VITAL SIGNS: ED Triage Vitals [08/07/17 1006]  Enc Vitals Group     BP (!) 137/51     Pulse Rate 79     Resp 16     Temp 98.3 F (36.8 C)     Temp Source Oral     SpO2 97 %     Weight 143 lb (64.9 kg)     Height 4\' 10"  (1.473 m)     Head Circumference      Peak Flow      Pain Score 0   Constitutional: Awake and alert.  Eyes: Conjunctivae are normal.  ENT      Head: Normocephalic and atraumatic.      Nose: No congestion/rhinnorhea.      Mouth/Throat: Mucous membranes are moist.      Neck: No stridor. Hematological/Lymphatic/Immunilogical: No cervical lymphadenopathy. Cardiovascular: Normal rate, regular rhythm.  No murmurs, rubs, or gallops.  Respiratory: Normal respiratory effort without tachypnea nor retractions. Breath sounds are clear and equal bilaterally. No wheezes/rales/rhonchi. Gastrointestinal: Soft and non tender. No rebound. No guarding.  Genitourinary: Deferred Musculoskeletal: Normal range of motion in all extremities. No lower extremity edema. Neurologic:  Awake and alert. Not completely oriented. Moves all extremities. Sensation  intact.  Skin:  Skin is warm, dry and intact. No rash noted.  ____________________________________________    LABS (pertinent positives/negatives)  CBC wbc 10.1, hgb 13.6, plt 300 Trop <0.03 BMP na 139, k 4.0, glu 137, cr 0.54 UA hazy, >50 WBC, large leukocytes ____________________________________________   EKG  None  ____________________________________________    RADIOLOGY  CXR No acute process   ____________________________________________   PROCEDURES  Procedures  ____________________________________________   INITIAL IMPRESSION / ASSESSMENT AND PLAN / ED COURSE  Pertinent labs & imaging results that were available during my care of the patient were reviewed by me and considered in my medical decision making (see chart for details).   Patient presented to the emergency department today because of concerns for cough and confusion.  Terms of cough differential would be broad  including colitis pneumonia pneumothorax amongst other etiologies.  Chest x-ray without any acute findings.  Terms of the confusion would have concerns for electrolyte abnormality infection intracranial process.  However I believe infection most likely.  Urine is consistent with urinary tract infection.  Did offer and recommend admission for IV antibiotics to patient and family.  However family requested that the patient be discharged home.  At this point I do not think that is completely unreasonable given lack of leukocytosis in the blood.  Patient will be given dose of IV antibiotics emergency department and discharged home.  She does have 24-hour caregivers. Discussed returning to the ER for worsening symptoms.   ____________________________________________   FINAL CLINICAL IMPRESSION(S) / ED DIAGNOSES  Final diagnoses:  Lower urinary tract infectious disease     Note: This dictation was prepared with Dragon dictation. Any transcriptional errors that result from this process are  unintentional     Nance Pear, MD 08/07/17 1330

## 2017-08-08 LAB — URINE CULTURE: CULTURE: NO GROWTH

## 2017-09-14 ENCOUNTER — Other Ambulatory Visit: Payer: Self-pay | Admitting: Family Medicine

## 2017-11-08 ENCOUNTER — Other Ambulatory Visit: Payer: Self-pay | Admitting: Family Medicine

## 2018-02-02 ENCOUNTER — Ambulatory Visit (INDEPENDENT_AMBULATORY_CARE_PROVIDER_SITE_OTHER): Payer: PPO | Admitting: Family Medicine

## 2018-02-02 ENCOUNTER — Ambulatory Visit (INDEPENDENT_AMBULATORY_CARE_PROVIDER_SITE_OTHER)
Admission: RE | Admit: 2018-02-02 | Discharge: 2018-02-02 | Disposition: A | Discharge status: Home / Self Care | Payer: PPO | Source: Ambulatory Visit | Attending: Family Medicine | Admitting: Family Medicine

## 2018-02-02 ENCOUNTER — Encounter: Payer: Self-pay | Admitting: Family Medicine

## 2018-02-02 VITALS — BP 126/74 | HR 71 | Temp 97.8°F | Ht <= 58 in | Wt 139.6 lb

## 2018-02-02 DIAGNOSIS — F0390 Unspecified dementia without behavioral disturbance: Secondary | ICD-10-CM

## 2018-02-02 DIAGNOSIS — S3993XA Unspecified injury of pelvis, initial encounter: Secondary | ICD-10-CM | POA: Diagnosis not present

## 2018-02-02 DIAGNOSIS — R8290 Unspecified abnormal findings in urine: Secondary | ICD-10-CM | POA: Diagnosis not present

## 2018-02-02 DIAGNOSIS — S79911A Unspecified injury of right hip, initial encounter: Secondary | ICD-10-CM

## 2018-02-02 DIAGNOSIS — W19XXXA Unspecified fall, initial encounter: Secondary | ICD-10-CM | POA: Diagnosis not present

## 2018-02-02 DIAGNOSIS — R102 Pelvic and perineal pain: Secondary | ICD-10-CM | POA: Diagnosis not present

## 2018-02-02 MED ORDER — FUROSEMIDE 20 MG PO TABS: 10.0000 mg | ORAL_TABLET | Freq: Every day | ORAL | 1 refills | Status: DC

## 2018-02-02 NOTE — Patient Instructions (Addendum)
Xray today. Return urine when able.  I am concerned about left pelvic fracture. Schedule tylenol 500-1000mg  three times daily.  Return in 1 week for repeat xrays. Good to see you today!

## 2018-02-02 NOTE — Assessment & Plan Note (Signed)
Unable to provide sample - will return with this.

## 2018-02-02 NOTE — Assessment & Plan Note (Addendum)
Ongoing RIGHT pelvic pain but hip and knee and lower back overall normal on exam today - check pelvic xrays. Concern for LEFT pubic fragility fracture which does not correlate with side of pain. Will await radiology evaluation. Discussed tylenol use for analgesia - currently using 500mg  TID, suggested 1000mg  at a time. I did ask her to return in 1 week for repeat pelvic films

## 2018-02-02 NOTE — Progress Notes (Signed)
BP 126/74 (BP Location: Left Arm, Patient Position: Sitting, Cuff Size: Normal)   Pulse 71   Temp 97.8 F (36.6 C) (Oral)   Ht 4\' 9"  (1.448 m)   Wt 139 lb 9 oz (63.3 kg)   SpO2 100%   BMI 30.20 kg/m    CC: R hip pain after fall  Subjective:    Patient ID: Rhonda Foley, female    DOB: 1917/01/20, 84 y.o.   MRN: [id number]  HPI: Rhonda Foley is a 84 y.o. female presenting on 02/02/2018 for Hip Pain (C/o right hip pain due to a fall on 01/30/18.  Also, has pain in lower back on left side. Pain is worse going to standing/sitting posiiton. Tried Tylenol. Pt accompanied by her daughter-in-law, [name]. )   DOI: 01/30/2018 Unwitnessed fall at home. Caregiver had gone to make lunch for her. Jhordan was seated but got up on her own, may have fallen onto end table. Needed help getting back up. Worse pain with transitions from sitting to standing. Some dizzy episodes.  Treating pain with tylenol 500mg  with benefit.   More difficulty with ambulation recently.  Urine has been smelling stronger recently "foul odor". No dysuria, urgency, frequency. No fevers, abd pain Wears depends at night - but doesn't regularly wet.   Relevant past medical, surgical, family and social history reviewed and updated as indicated. Interim medical history since our last visit reviewed. Allergies and medications reviewed and updated. Outpatient Medications Prior to Visit  Medication Sig Dispense Refill  . acetaminophen (TYLENOL) 500 MG tablet Take 1,000 mg by mouth 2 (two) times daily as needed for fever (pain).     Marland Kitchen albuterol (PROVENTIL HFA;VENTOLIN HFA) 108 (90 Base) MCG/ACT inhaler Inhale 2 puffs into the lungs every 6 (six) hours as needed for wheezing or shortness of breath. 1 Inhaler 2  . cetirizine (ZYRTEC) 10 MG tablet Take 10 mg by mouth daily.    Marland Kitchen docusate sodium (COLACE) 100 MG capsule Take 100 mg by mouth daily as needed for mild constipation.    . Multiple Vitamin (MULTIVITAMIN WITH MINERALS) TABS  tablet Take 1 tablet by mouth daily.    . polyethylene glycol powder (GLYCOLAX/MIRALAX) powder Take 4 g by mouth daily as needed for moderate constipation. 500 g 0  . furosemide (LASIX) 20 MG tablet TAKE 0.5 TABLETS (10 MG TOTAL) BY MOUTH DAILY. 30 tablet 1  . azithromycin (ZITHROMAX) 250 MG tablet Take two tablets on day one followed by one tablet on days 2-5 6 each 0  . cephALEXin (KEFLEX) 500 MG capsule Take 1 capsule (500 mg total) by mouth 3 (three) times daily. 30 capsule 0   No facility-administered medications prior to visit.      Per HPI unless specifically indicated in ROS section below Review of Systems     Objective:    BP 126/74 (BP Location: Left Arm, Patient Position: Sitting, Cuff Size: Normal)   Pulse 71   Temp 97.8 F (36.6 C) (Oral)   Ht 4\' 9"  (1.448 m)   Wt 139 lb 9 oz (63.3 kg)   SpO2 100%   BMI 30.20 kg/m   Wt Readings from Last 3 Encounters:  02/02/18 139 lb 9 oz (63.3 kg)  08/07/17 143 lb (64.9 kg)  05/30/17 143 lb (64.9 kg)    Physical Exam  Constitutional: She appears well-developed and well-nourished. No distress.  HENT:  Mouth/Throat: Oropharynx is clear and moist. No oropharyngeal exudate.  Cardiovascular: Normal rate, regular rhythm and normal heart  sounds.  No murmur heard. Pulmonary/Chest: Effort normal and breath sounds normal. No respiratory distress. She has no wheezes. She has no rales.  Crackles bibasilarly  Abdominal: Soft. Bowel sounds are normal. She exhibits no distension and no mass. There is no tenderness. There is no rebound and no guarding. No hernia.  Musculoskeletal: Normal range of motion.  No pain to palpation midline lumbar spine No pain with testing ROM of knees or hips  Tender to palpation anterior pelvis without obvious point tenderness  Nursing note and vitals reviewed.     Assessment & Plan:   Problem List Items Addressed This Visit    Fall with injury - Primary    Ongoing RIGHT pelvic pain but hip and knee and  lower back overall normal on exam today - check pelvic xrays. Concern for LEFT pubic fragility fracture which does not correlate with side of pain. Will await radiology evaluation. Discussed tylenol use for analgesia - currently using 500mg  TID, suggested 1000mg  at a time. I did ask her to return in 1 week for repeat pelvic films       Relevant Orders   DG Pelvis 1-2 Views   DG Pelvis 1-2 Views   Bad odor of urine    Unable to provide sample - will return with this.           Meds ordered this encounter  Medications  . furosemide (LASIX) 20 MG tablet    Sig: Take 0.5 tablets (10 mg total) by mouth daily.    Dispense:  30 tablet    Refill:  1   Orders Placed This Encounter  Procedures  . DG Pelvis 1-2 Views    Standing Status:   Future    Number of Occurrences:   1    Standing Expiration Date:   04/06/2019    Order Specific Question:   Reason for Exam (SYMPTOM  OR DIAGNOSIS REQUIRED)    Answer:   R pelvic pain after fall    Order Specific Question:   Preferred imaging location?    Answer:   [facility]    Order Specific Question:   Radiology Contrast Protocol - do NOT remove file path    Answer:   \\[email or web address]  . DG Pelvis 1-2 Views    Standing Status:   Future    Standing Expiration Date:   04/06/2019    Order Specific Question:   Reason for Exam (SYMPTOM  OR DIAGNOSIS REQUIRED)    Answer:   eval pelvic fracture    Order Specific Question:   Preferred imaging location?    Answer:   [facility]    Order Specific Question:   Radiology Contrast Protocol - do NOT remove file path    Answer:   \\[email or web address].[email or web address]    Follow up plan: Return if symptoms worsen or fail to improve.  [name], MD

## 2018-02-06 ENCOUNTER — Telehealth (INDEPENDENT_AMBULATORY_CARE_PROVIDER_SITE_OTHER): Payer: PPO

## 2018-02-06 ENCOUNTER — Other Ambulatory Visit: Payer: PPO

## 2018-02-06 DIAGNOSIS — R8290 Unspecified abnormal findings in urine: Secondary | ICD-10-CM

## 2018-02-06 LAB — POC URINALSYSI DIPSTICK (AUTOMATED)
Bilirubin, UA: NEGATIVE
Blood, UA: NEGATIVE
GLUCOSE UA: NEGATIVE
Ketones, UA: NEGATIVE
Nitrite, UA: NEGATIVE
Protein, UA: NEGATIVE
Spec Grav, UA: 1.015 (ref 1.010–1.025)
UROBILINOGEN UA: 0.2 U/dL
pH, UA: 8 (ref 5.0–8.0)

## 2018-02-06 MED ORDER — CEPHALEXIN 500 MG PO CAPS: 500.0000 mg | ORAL_CAPSULE | Freq: Two times a day (BID) | ORAL | 0 refills | Status: DC

## 2018-02-06 NOTE — Telephone Encounter (Signed)
Spoke with pt's daughter-in-law, [name] (on dpr), relaying Dr. Synthia Innocent message. Verbalizes understanding.  States pt's side pain seems to have improved a little. However, her confusion has worsened. [name] states that would make since if the pt has a UTI.  Will call after pt has taken abx.

## 2018-02-06 NOTE — Telephone Encounter (Signed)
Pt's daughter-in-law, [name], dropped off pt's urine sample. Pt was seen 02/02/18.  Did UA. See results.

## 2018-02-06 NOTE — Telephone Encounter (Signed)
Plz call - UA suggesting infection will send culture and would like her to start keflex 500mg  twice daily for 1 week - update [place] now and after abx with how she's doing.

## 2018-02-07 LAB — URINE CULTURE
MICRO NUMBER:: 91471061
SPECIMEN QUALITY:: ADEQUATE

## 2018-07-17 DIAGNOSIS — H903 Sensorineural hearing loss, bilateral: Secondary | ICD-10-CM | POA: Diagnosis not present

## 2018-07-17 DIAGNOSIS — H6123 Impacted cerumen, bilateral: Secondary | ICD-10-CM | POA: Diagnosis not present

## 2018-09-18 DIAGNOSIS — H353132 Nonexudative age-related macular degeneration, bilateral, intermediate dry stage: Secondary | ICD-10-CM | POA: Diagnosis not present

## 2018-10-30 ENCOUNTER — Other Ambulatory Visit: Payer: Self-pay | Admitting: Family Medicine

## 2018-10-30 NOTE — Telephone Encounter (Signed)
LOV 02/02/2018, no future appointments.

## 2018-11-16 ENCOUNTER — Ambulatory Visit (INDEPENDENT_AMBULATORY_CARE_PROVIDER_SITE_OTHER): Payer: PPO

## 2018-11-16 DIAGNOSIS — Z23 Encounter for immunization: Secondary | ICD-10-CM | POA: Diagnosis not present

## 2019-01-15 ENCOUNTER — Other Ambulatory Visit: Payer: Self-pay

## 2019-01-15 ENCOUNTER — Ambulatory Visit (INDEPENDENT_AMBULATORY_CARE_PROVIDER_SITE_OTHER): Payer: PPO | Admitting: Family Medicine

## 2019-01-15 ENCOUNTER — Encounter: Payer: Self-pay | Admitting: Family Medicine

## 2019-01-15 VITALS — BP 126/80 | HR 66 | Temp 98.4°F | Ht <= 58 in | Wt 133.6 lb

## 2019-01-15 DIAGNOSIS — H353190 Nonexudative age-related macular degeneration, unspecified eye, stage unspecified: Secondary | ICD-10-CM | POA: Insufficient documentation

## 2019-01-15 DIAGNOSIS — F05 Delirium due to known physiological condition: Secondary | ICD-10-CM | POA: Diagnosis not present

## 2019-01-15 DIAGNOSIS — H353130 Nonexudative age-related macular degeneration, bilateral, stage unspecified: Secondary | ICD-10-CM

## 2019-01-15 DIAGNOSIS — I5032 Chronic diastolic (congestive) heart failure: Secondary | ICD-10-CM

## 2019-01-15 DIAGNOSIS — R451 Restlessness and agitation: Secondary | ICD-10-CM | POA: Diagnosis not present

## 2019-01-15 DIAGNOSIS — I071 Rheumatic tricuspid insufficiency: Secondary | ICD-10-CM | POA: Diagnosis not present

## 2019-01-15 DIAGNOSIS — I340 Nonrheumatic mitral (valve) insufficiency: Secondary | ICD-10-CM

## 2019-01-15 DIAGNOSIS — J310 Chronic rhinitis: Secondary | ICD-10-CM

## 2019-01-15 LAB — POC URINALSYSI DIPSTICK (AUTOMATED)
Blood, UA: NEGATIVE
Glucose, UA: NEGATIVE
Ketones, UA: NEGATIVE
Nitrite, UA: NEGATIVE
Protein, UA: NEGATIVE
Spec Grav, UA: >=1.03 — AB (ref 1.010–1.025)
Urobilinogen, UA: 0.2 E.U./dL
pH, UA: 5.5 (ref 5.0–8.0)

## 2019-01-15 NOTE — Assessment & Plan Note (Addendum)
Ongoing despite oral antihistamine and regular flonase - will trial astelin nasal spray.

## 2019-01-15 NOTE — Assessment & Plan Note (Signed)
Weight loss noted. Seems euvolemic. She continues lasix 10mg  daily - suggested QOD dosing.

## 2019-01-15 NOTE — Assessment & Plan Note (Addendum)
Mild at this time, worse last several days with increased restlessness endorsed. With known leg pain attributed to osteoarthritis, difficulty could be due to worse pain - will increase tylenol to 500mg  TID to see effect. Will check for UTI as possible cause of acute deterioration. Discussed conservative measures such as redirection, relaxing muscle at bedtime, aromatherapy. If ineffective, consider trazodone vs benzo.

## 2019-01-15 NOTE — Patient Instructions (Addendum)
Try astelin nasal spray for runny nose - if working better may back off oral antihistamine (zyrtec or alegra).  Change lasix to every other day, and if doing well, change to as needed.  Urine with small amount of white cells - will send off culture and call you with results.

## 2019-01-15 NOTE — Progress Notes (Addendum)
This visit was conducted in person.  BP 126/80 (BP Location: Right Arm, Patient Position: Sitting, Cuff Size: Normal)   Pulse 66   Temp 98.4 F (36.9 C) (Temporal)   Ht 4\' 9"  (1.448 m)   Wt 133 lb 9 oz (60.6 kg)   BMI 28.90 kg/m    CC: nasal congestion, restlessness Subjective:    Patient ID: Rhonda Foley, female    DOB: 04/15/1916, 85 y.o.   MRN: [id number]  HPI: Rhonda Foley is a 85 y.o. female presenting on 01/15/2019 for Agitation (Per [name], pt can not be still and is not staying in bed at night.  Thinks pt may have UTI. [name], [name]. ) and Nasal Congestion (C/o nasal congestion and drainage. )   Celebrated 89th B-day this year! DIL brings pictures of drive by party she had.   3d h/o restlessness day and night - wearing out caretakers. Trouble falling asleep at night. Last night was better after taking 2 tylenol (normally just takes 1). Some sundowning in afternoons - wanting to go "home". Knows to go to bathroom, just wears depends at night time.   Good appetite. Eating with fingers in place of utensils.  Few near falls.   Found to have macular degeneration.   Ongoing constant rhinorrhea - treating with alternating zyrtec and alegra.      Relevant past medical, surgical, family and social history reviewed and updated as indicated. Interim medical history since our last visit reviewed. Allergies and medications reviewed and updated. Outpatient Medications Prior to Visit  Medication Sig Dispense Refill  . acetaminophen (TYLENOL) 500 MG tablet Take 1,000 mg by mouth 2 (two) times daily as needed for fever (pain).     Marland Kitchen albuterol (PROVENTIL HFA;VENTOLIN HFA) 108 (90 Base) MCG/ACT inhaler Inhale 2 puffs into the lungs every 6 (six) hours as needed for wheezing or shortness of breath. 1 Inhaler 2  . cetirizine (ZYRTEC) 10 MG tablet Take 10 mg by mouth daily.    Marland Kitchen docusate sodium (COLACE) 100 MG capsule Take 100 mg by mouth daily as needed  for mild constipation.    . furosemide (LASIX) 20 MG tablet TAKE 0.5 TABLETS (10 MG TOTAL) BY MOUTH DAILY. 30 tablet 1  . Multiple Vitamin (MULTIVITAMIN WITH MINERALS) TABS tablet Take 1 tablet by mouth daily.    . cephALEXin (KEFLEX) 500 MG capsule Take 1 capsule (500 mg total) by mouth 2 (two) times daily. 14 capsule 0  . polyethylene glycol powder (GLYCOLAX/MIRALAX) powder Take 4 g by mouth daily as needed for moderate constipation. 500 g 0   No facility-administered medications prior to visit.      Per HPI unless specifically indicated in ROS section below Review of Systems Objective:    BP 126/80 (BP Location: Right Arm, Patient Position: Sitting, Cuff Size: Normal)   Pulse 66   Temp 98.4 F (36.9 C) (Temporal)   Ht 4\' 9"  (1.448 m)   Wt 133 lb 9 oz (60.6 kg)   BMI 28.90 kg/m   Wt Readings from Last 3 Encounters:  01/15/19 133 lb 9 oz (60.6 kg)  02/02/18 139 lb 9 oz (63.3 kg)  08/07/17 143 lb (64.9 kg)    Physical Exam Vitals signs and nursing note reviewed.  Constitutional:      General: She is not in acute distress.    Appearance: Normal appearance. She is not ill-appearing.  HENT:     Nose: Rhinorrhea present.     Mouth/Throat:  Mouth: Mucous membranes are moist.     Pharynx: Oropharynx is clear. No posterior oropharyngeal erythema.  Eyes:     Extraocular Movements: Extraocular movements intact.     Pupils: Pupils are equal, round, and reactive to light.  Cardiovascular:     Rate and Rhythm: Normal rate and regular rhythm.     Pulses: Normal pulses.     Heart sounds: Normal heart sounds. No murmur.  Pulmonary:     Effort: Pulmonary effort is normal. No respiratory distress.     Breath sounds: No wheezing or rhonchi.     Comments: Coarse crackles bibasilarly Abdominal:     General: There is no distension.     Palpations: Abdomen is soft. There is no mass.     Tenderness: There is no abdominal tenderness. There is no guarding or rebound.     Hernia: No  hernia is present.  Musculoskeletal:     Right lower leg: No edema.     Left lower leg: No edema.  Neurological:     Mental Status: She is alert.  Psychiatric:        Mood and Affect: Mood normal.        Behavior: Behavior normal.       Results for orders placed or performed in visit on 01/15/19  POCT Urinalysis Dipstick (Automated)  Result Value Ref Range   Color, UA yellow    Clarity, UA cloudy    Glucose, UA Negative Negative   Bilirubin, UA 1+    Ketones, UA negative    Spec Grav, UA >=1.030 (A) 1.010 - 1.025   Blood, UA negative    pH, UA 5.5 5.0 - 8.0   Protein, UA Negative Negative   Urobilinogen, UA 0.2 0.2 or 1.0 E.U./dL   Nitrite, UA negative    Leukocytes, UA Moderate (2+) (A) Negative   Lab Results  Component Value Date   CREATININE 0.54 08/07/2017   BUN 18 08/07/2017   NA 139 08/07/2017   K 4.0 08/07/2017   CL 108 08/07/2017   CO2 26 08/07/2017    Lab Results  Component Value Date   ALT 11 05/27/2016   AST 18 05/27/2016   ALKPHOS 63 05/27/2016   BILITOT 0.2 05/27/2016    Assessment & Plan:   Problem List Items Addressed This Visit    Tricuspid regurgitation   Sundowning - Primary    Mild at this time, worse last several days with increased restlessness endorsed. With known leg pain attributed to osteoarthritis, difficulty could be due to worse pain - will increase tylenol to 500mg  TID to see effect. Will check for UTI as possible cause of acute deterioration. Discussed conservative measures such as redirection, relaxing muscle at bedtime, aromatherapy. If ineffective, consider trazodone vs benzo.       Relevant Orders   POCT Urinalysis Dipstick (Automated) (Completed)   Urine culture   Mitral regurgitation   Macular degeneration, age related, nonexudative   Chronic rhinitis    Ongoing despite oral antihistamine and regular flonase - will trial astelin nasal spray.       (HFpEF) heart failure with preserved ejection fraction (HCC)    Weight  loss noted. Seems euvolemic. She continues lasix 10mg  daily - suggested QOD dosing.        Other Visit Diagnoses    Restlessness and agitation       Relevant Orders   POCT Urinalysis Dipstick (Automated) (Completed)   Urine culture       No orders of  the defined types were placed in this encounter.  Orders Placed This Encounter  Procedures  . Urine culture  . POCT Urinalysis Dipstick (Automated)   Patient Instructions  Try astelin nasal spray for runny nose - if working better may back off oral antihistamine (zyrtec or alegra).  Change lasix to every other day, and if doing well, change to as needed.  Urine with small amount of white cells - will send off culture and call you with results.    Follow up plan: Return if symptoms worsen or fail to improve.  [name], MD

## 2019-01-16 ENCOUNTER — Telehealth: Payer: Self-pay | Admitting: Family Medicine

## 2019-01-16 LAB — URINE CULTURE
MICRO NUMBER:: 1104450
Result:: NO GROWTH
SPECIMEN QUALITY:: ADEQUATE

## 2019-01-16 MED ORDER — FUROSEMIDE 20 MG PO TABS: 10.0000 mg | ORAL_TABLET | Freq: Every day | ORAL | 1 refills | Status: DC

## 2019-01-16 MED ORDER — AZELASTINE HCL 0.1 % NA SOLN: 1.0000 | Freq: Two times a day (BID) | NASAL | 3 refills | Status: DC

## 2019-01-16 NOTE — Telephone Encounter (Addendum)
Sorry about that - plz notify this was sent in today to [facility].

## 2019-01-16 NOTE — Telephone Encounter (Signed)
[name] called stating pt saw dr g yesterday 11/16 and he was going to send a rx for nasal spray and when she went by the pharmacy they did not have rx   Please advise when this has been sent in  [facility] [address] [place]

## 2019-01-16 NOTE — Telephone Encounter (Signed)
Spoke with pt's daughter-in-law, [name], relaying Dr. Synthia Innocent message.  Verbalizes understanding.

## 2019-01-16 NOTE — Addendum Note (Signed)
Addended by: [name] on: 01/16/2019 01:05 PM   Modules accepted: Orders

## 2019-01-22 ENCOUNTER — Telehealth: Payer: Self-pay | Admitting: Family Medicine

## 2019-01-22 MED ORDER — TRAZODONE HCL 50 MG PO TABS: 25.0000 mg | ORAL_TABLET | Freq: Every evening | ORAL | 1 refills | Status: DC | PRN

## 2019-01-22 NOTE — Telephone Encounter (Signed)
Ok let's try trazodone 50mg  1/2 tab at night to help with sleep - this is antidepressant that is sedating so can be used to help with sleep.

## 2019-01-22 NOTE — Telephone Encounter (Signed)
Patient was seen last week by Dr.G.  Dr.G said she could try a medication to calm her restlessness.  [name] would like the medication called in to [facility] representative

## 2019-01-22 NOTE — Addendum Note (Signed)
Addended by: [name] on: 01/22/2019 11:23 PM   Modules accepted: Orders

## 2019-01-23 NOTE — Telephone Encounter (Signed)
Spoke with pt's daughter-in-law, [name] (on dpr), relaying Dr. Synthia Innocent message.  Verbalizes understanding.

## 2019-02-15 DIAGNOSIS — H903 Sensorineural hearing loss, bilateral: Secondary | ICD-10-CM | POA: Diagnosis not present

## 2019-02-15 DIAGNOSIS — H6123 Impacted cerumen, bilateral: Secondary | ICD-10-CM | POA: Diagnosis not present

## 2019-02-15 DIAGNOSIS — J300 Vasomotor rhinitis: Secondary | ICD-10-CM | POA: Diagnosis not present

## 2019-03-17 ENCOUNTER — Other Ambulatory Visit: Payer: Self-pay | Admitting: Family Medicine

## 2019-03-26 ENCOUNTER — Other Ambulatory Visit: Payer: Self-pay

## 2019-03-26 ENCOUNTER — Ambulatory Visit (INDEPENDENT_AMBULATORY_CARE_PROVIDER_SITE_OTHER): Payer: PPO | Admitting: Internal Medicine

## 2019-03-26 ENCOUNTER — Encounter: Payer: Self-pay | Admitting: Internal Medicine

## 2019-03-26 DIAGNOSIS — R410 Disorientation, unspecified: Secondary | ICD-10-CM | POA: Diagnosis not present

## 2019-03-26 LAB — POC URINALSYSI DIPSTICK (AUTOMATED)
Bilirubin, UA: NEGATIVE
Blood, UA: NEGATIVE
Glucose, UA: NEGATIVE
Leukocytes, UA: NEGATIVE
Nitrite, UA: NEGATIVE
Protein, UA: NEGATIVE
Spec Grav, UA: 1.02 (ref 1.010–1.025)
Urobilinogen, UA: 0.2 E.U./dL
pH, UA: 6.5 (ref 5.0–8.0)

## 2019-03-26 NOTE — Progress Notes (Signed)
Virtual Visit via Video Note  I connected with Rhonda Foley on 03/26/19 at  2:45 PM EST by a video enabled telemedicine application and verified that I am speaking with the correct person using two identifiers.  Location: Patient: Home Provider: Office   I discussed the limitations of evaluation and management by telemedicine and the availability of in person appointments. The patient expressed understanding and agreed to proceed.  History of Present Illness:  Pt's daughter in law reports increased confusion. This started about 3 weeks ago. Her daughter in law reports that she has been more confused, more lethargic. They are having a harder time getting her to eat and drink. They have not noticed any fever, chills, nausea or vomiting. She declines low back pain. She has not taken anything OTC for this.   Past Medical History:  Diagnosis Date  . Bleeding stomach ulcer 1993; 12/99  . CAP (community acquired pneumonia) 02/24/2015  . Cardiomegaly 04/2013   by CXR  . COPD (chronic obstructive pulmonary disease) ([place]) 04/2013   by CXR  . Dementia ([place]) 06/2011   MMSE 14/30, thought senile although fmhx alz type; did not respond to aricept trial  . History of UTI    "infrequent"  . HLD (hyperlipidemia) 1991   "not on RX for years" (02/24/2015)  . HTN (hypertension) 1997  . Osteoarthritis   . Osteopenia 04/2013   by CXR  . Short-term memory loss   . Shoulder fracture, right 01/2008   "fell off the porch; put her in a brace; no OR; Dr. Reche Dixon"  . Thoracic compression fracture ([place]) 04/2013   by CXR    Current Outpatient Medications  Medication Sig Dispense Refill  . acetaminophen (TYLENOL) 500 MG tablet Take 1,000 mg by mouth 2 (two) times daily as needed for fever (pain).     Marland Kitchen albuterol (PROVENTIL HFA;VENTOLIN HFA) 108 (90 Base) MCG/ACT inhaler Inhale 2 puffs into the lungs every 6 (six) hours as needed for wheezing or shortness of breath. 1 Inhaler 2  . cetirizine (ZYRTEC) 10  MG tablet Take 10 mg by mouth daily.    Marland Kitchen docusate sodium (COLACE) 100 MG capsule Take 100 mg by mouth daily as needed for mild constipation.    . furosemide (LASIX) 20 MG tablet Take 0.5 tablets (10 mg total) by mouth daily. 30 tablet 1  . Multiple Vitamin (MULTIVITAMIN WITH MINERALS) TABS tablet Take 1 tablet by mouth daily.    . traZODone (DESYREL) 50 MG tablet TAKE 0.5 TABLETS (25 MG TOTAL) BY MOUTH AT BEDTIME AS NEEDED FOR SLEEP. 45 tablet 3  . ipratropium (ATROVENT) 0.03 % nasal spray SMARTSIG:2 Spray(s) Both Nares 3 Times Daily PRN     No current facility-administered medications for this visit.    Allergies  Allergen Reactions  . Nsaids Nausea And Vomiting and Other (See Comments)    Pt is not supposed to take because of GI bleed    Family History  Problem Relation Age of Onset  . Other Father        stomach problem; terminal stomach pain/"lost his mind"  . Alzheimer's disease Mother   . Cancer Brother   . Cancer Sister   . Dementia Sister   . Hypertension Brother   . Hypertension Sister     Social History   Socioeconomic History  . Marital status: Widowed    Spouse name: Not on file  . Number of children: 3  . Years of education: Not on file  . Highest  education level: Not on file  Occupational History  . Occupation: retired  Tobacco Use  . Smoking status: Never Smoker  . Smokeless tobacco: Never Used  Substance and Sexual Activity  . Alcohol use: No    Alcohol/week: 0.0 standard drinks  . Drug use: No  . Sexual activity: Never  Other Topics Concern  . Not on file  Social History Narrative   Lives at home, 24/7 caregiver.   Niece and [name]) also involved   Family cooks for her.   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   Social Determinants of Health   Financial Resource Strain:   . Difficulty of Paying Living Expenses: Not on file  Food Insecurity:   . Worried About Charity fundraiser in the Last Year: Not on file  . Ran Out  of Food in the Last Year: Not on file  Transportation Needs:   . Lack of Transportation (Medical): Not on file  . Lack of Transportation (Non-Medical): Not on file  Physical Activity:   . Days of Exercise per Week: Not on file  . Minutes of Exercise per Session: Not on file  Stress:   . Feeling of Stress : Not on file  Social Connections:   . Frequency of Communication with Friends and Family: Not on file  . Frequency of Social Gatherings with Friends and Family: Not on file  . Attends Religious Services: Not on file  . Active Member of Clubs or Organizations: Not on file  . Attends Archivist Meetings: Not on file  . Marital Status: Not on file  Intimate Partner Violence:   . Fear of Current or Ex-Partner: Not on file  . Emotionally Abused: Not on file  . Physically Abused: Not on file  . Sexually Abused: Not on file     Constitutional: Denies fever, malaise, fatigue, headache or abrupt weight changes.  Respiratory: Denies difficulty breathing, shortness of breath, cough or sputum production.   Cardiovascular: Denies chest pain, chest tightness, palpitations or swelling in the hands or feet.  Gastrointestinal: Pt reports poor appetite. Denies abdominal pain, bloating, constipation, diarrhea or blood in the stool.  GU: Denies urgency, frequency, pain with urination, burning sensation, blood in urine, odor or discharge. Musculoskeletal: Pt reports weakness. Denies decrease in range of motion, difficulty with gait, muscle pain or joint pain and swelling.  Skin: Denies redness, rashes, lesions or ulcercations.  Neurological: Pt reports confusion, difficulty with memory.   No other specific complaints in a complete review of systems (except as listed in HPI above).    Observations/Objective:   Wt Readings from Last 3 Encounters:  01/15/19 133 lb 9 oz (60.6 kg)  02/02/18 139 lb 9 oz (63.3 kg)  08/07/17 143 lb (64.9 kg)    General: Appears her stated age, well  developed, well nourished in NAD. Pulmonary/Chest: Normal effort. No respiratory distress.  Neurological: Alert.   BMET    Component Value Date/Time   NA 139 08/07/2017 1034   K 4.0 08/07/2017 1034   CL 108 08/07/2017 1034   CO2 26 08/07/2017 1034   GLUCOSE 137 (H) 08/07/2017 1034   BUN 18 08/07/2017 1034   CREATININE 0.54 08/07/2017 1034   CALCIUM 8.7 (L) 08/07/2017 1034   GFRNONAA >60 08/07/2017 1034   GFRAA >60 08/07/2017 1034    Lipid Panel     Component Value Date/Time   CHOL 262 (H) 03/09/2011 0904   TRIG 95.0 03/09/2011 0904   HDL 83.20 03/09/2011  0904   CHOLHDL 3 03/09/2011 0904   VLDL 19.0 03/09/2011 0904    CBC    Component Value Date/Time   WBC 10.1 08/07/2017 1034   RBC 4.50 08/07/2017 1034   HGB 13.6 08/07/2017 1034   HCT 40.4 08/07/2017 1034   PLT 300 08/07/2017 1034   MCV 89.9 08/07/2017 1034   MCH 30.2 08/07/2017 1034   MCHC 33.6 08/07/2017 1034   RDW 14.3 08/07/2017 1034   LYMPHSABS 1.4 08/07/2017 1034   MONOABS 1.3 (H) 08/07/2017 1034   EOSABS 0.4 08/07/2017 1034   BASOSABS 0.1 08/07/2017 1034    Hgb A1C No results found for: HGBA1C     Assessment and Plan:  Confusion:  Urinalysis: normal Will send urine culture Push fluids Likely r/t decline in cognitive/funcyional status- advised daughter in law she could discuss this with PCP at next visit Daughter in Law wanted to make sure confusion was not related to UTI  Return precautions discussed  Follow Up Instructions:    I discussed the assessment and treatment plan with the patient. The patient was provided an opportunity to ask questions and all were answered. The patient agreed with the plan and demonstrated an understanding of the instructions.   The patient was advised to call back or seek an in-person evaluation if the symptoms worsen or if the condition fails to improve as anticipated.    Webb Silversmith, NP

## 2019-03-26 NOTE — Patient Instructions (Signed)
Confusion °Confusion is the inability to think with the usual speed or clarity. People who are confused often describe their thinking as cloudy or unclear. Confusion can also include feeling disoriented. This means you are unaware of where you are or who you are. You may also not know the date or time. When confused, you may have difficulty remembering, paying attention, or making decisions. Some people also act aggressively when they are confused. °In some cases, confusion may come on quickly. In other cases, it may develop slowly over time. How quickly confusion comes on depends on the cause. °Confusion may be caused by: °· Head injury (concussion). °· Seizures. °· Stroke. °· Fever. °· Brain tumor. °· Decrease in brain function due to a vascular or neurologic condition (dementia). °· Emotions, like rage or terror. °· Inability to know what is real and what is not (hallucinations). °· Infections, such as a urinary tract infection (UTI). °· Using too much alcohol, drugs, or medicines. °· Loss of fluid (dehydration) or an imbalance of salts in the body (electrolytes). °· Lack of sleep. °· Low blood sugar (diabetes). °· Low levels of oxygen. This comes from conditions such as chronic lung disorders. °· Side effects of medicines, or taking medicines that affect other medicines (drug interactions). °· Lack of certain nutrients, especially niacin, thiamine, vitamin C, or vitamin B. °· Sudden drop in body temperature (hypothermia). °· Change in routine, such as traveling or being hospitalized. °Follow these instructions at home: °Pay attention to your symptoms. Tell your health care provider about any changes or if you develop new symptoms. Follow these instructions to control or treat symptoms. Ask a family member or friend for help if needed. °Medicines °· Take over-the-counter and prescription medicines only as told by your health care provider. °· Ask your health care provider about changing or stopping any medicines  that may be causing your confusion. °· Avoid pain medicines or sleep medicines until you have fully recovered. °· Use a pillbox or an alarm to help you take the right medicines at the right time. °Lifestyle ° °· Eat a balanced diet that includes fruits and vegetables. °· Get enough sleep. For most adults, this is 7-9 hours each night. °· Do not drink alcohol. °· Do not become isolated. Spend time with other people and make plans for your days. °· Do not drive until your health care provider says that it is safe to do so. °· Do not use any products that contain nicotine or tobacco, such as cigarettes and e-cigarettes. If you need help quitting, ask your health care provider. °· Stop other activities that may increase your chances of getting hurt. These may include some work duties, sports activities, swimming, or bike riding. Ask your health care provider what activities are safe for you. °What caregivers can do °· Find out if the person is confused. Ask the person to state his or her name, age, and the date. If the person is unsure or answers incorrectly, he or she may be confused. °· Always introduce yourself, no matter how well the person knows you. °· Remind the person of his or her location. Do this often. °· Place a calendar and clock near the person who is confused. °· Talk about current events and plans for the day. °· Keep the environment calm, quiet, and peaceful. °· Help the person do the things that he or she is unable to do. These include: °? Taking medicines. °? Keeping follow-up visits with his or her health care   provider. °? Helping with household duties, including meal preparation. °? Running errands. °· Get help if you need it. There are several support groups for caregivers. °· If the person you are helping needs more support, consider day care, extended care programs, or a skilled nursing facility. The person's health care provider may be able to help evaluate these options. °General  instructions °· Monitor yourself for any conditions you may have. These may include: °? Checking your blood glucose levels, if you have diabetes. °? Watching your weight, if you are overweight. °? Monitoring your blood pressure, if you have hypertension. °? Monitoring your body temperature, if you have a fever. °· Keep all follow-up visits as told by your health care provider. This is important. °Contact a health care provider if: °· Your symptoms get worse. °Get help right away if you: °· Feel that you are not able to care for yourself. °· Develop severe headaches, repeated vomiting, seizures, blackouts, or slurred speech. °· Have increasing confusion, weakness, numbness, restlessness, or personality changes. °· Develop a loss of balance, have marked dizziness, feel uncoordinated, or fall. °· Develop severe anxiety, or you have delusions or hallucinations. °These symptoms may represent a serious problem that is an emergency. Do not wait to see if the symptoms will go away. Get medical help right away. Call your local emergency services (911 in the [place] Do not drive yourself to the hospital. °Summary °· Confusion is the inability to think with the usual speed or clarity. People who are confused often describe their thinking as cloudy or unclear. °· Confusion can also include having difficulty remembering, paying attention, or making decisions. °· Confusion may come on quickly or develop slowly over time, depending on the cause. There are many different causes of confusion. °· Ask for help from family members or friends if you are unable to take care of yourself. °This information is not intended to replace advice given to you by your health care provider. Make sure you discuss any questions you have with your health care provider. °Document Revised: 02/17/2017 Document Reviewed: 02/17/2017 °Elsevier Patient Education © 2020 [employer]vier Inc. ° °

## 2019-03-27 LAB — URINE CULTURE
MICRO NUMBER:: 10076793
SPECIMEN QUALITY:: ADEQUATE

## 2019-04-17 ENCOUNTER — Other Ambulatory Visit: Payer: Self-pay | Admitting: Family Medicine

## 2019-04-30 ENCOUNTER — Telehealth: Payer: Self-pay

## 2019-04-30 NOTE — Telephone Encounter (Signed)
Patient's daughter in law Rhonda Foley called stating that patient is having a lot of falling asleep and staying asleep. She wants to know if it is okay to increase her Trazadone to 50mg  a night, rather than 25mg ? Please advise.

## 2019-05-02 MED ORDER — TRAZODONE HCL 50 MG PO TABS: 50.0000 mg | ORAL_TABLET | Freq: Every evening | ORAL | 1 refills | Status: DC | PRN

## 2019-05-02 NOTE — Telephone Encounter (Signed)
Yes let's try trazodone 50mg  at night time. Update [place] with effect.

## 2019-05-02 NOTE — Addendum Note (Signed)
Addended by: [name] on: 05/02/2019 08:37 AM   Modules accepted: Orders

## 2019-05-02 NOTE — Telephone Encounter (Signed)
Spoke with pt's daughter-in-law, [name] (on dpr), relaying Dr. Synthia Innocent message.  Verbalizes understanding.

## 2019-05-14 ENCOUNTER — Other Ambulatory Visit: Payer: Self-pay | Admitting: Family Medicine

## 2019-05-15 NOTE — Telephone Encounter (Signed)
Plz schedule wellness and lab visits.  Then return encounter to me for refill.

## 2019-05-15 NOTE — Telephone Encounter (Signed)
Called and got patient scheduled for CPE and labs but patient's caregiver would like to get labs done the same day as physical. She also refused the AWV because its difficult for patient to talk.

## 2019-05-16 ENCOUNTER — Telehealth: Payer: Self-pay | Admitting: *Deleted

## 2019-05-16 NOTE — Telephone Encounter (Signed)
plz notify pharmacy should have new Rx already (sent 05/02/2019.).

## 2019-05-16 NOTE — Telephone Encounter (Signed)
Patient's daughter-in-law [name] called stating that patient was put on Trazodone to help her sleep. [name] stated that she started out taking a half and that helped for a while. [name] stated that they increased it to one at night and it is working much better. [name] stated because of it being increased to one she will be running out of medication in about a week. [name] wants to know if Dr. Danise Mina will send in a new script changing the directions to one at bedtime? [name] stated that they have about a week's worth left. Pharmacy [facility] [address]

## 2019-05-16 NOTE — Telephone Encounter (Addendum)
Spoke with Ronalee Belts at [facility] asking about trazadone rx.  Says per ins co, refill will be available on 05/26/19, even with change in directions.  Per Ronalee Belts a note is coming up stating pt was dispensed #90 on 03/19/19.  Ronalee Belts says pt can get a short supply to last until 05/26/19 but will have to pay out of pocket.   Spoke with daughter-in-law, [name] (on dpr), relaying message from [place].  Verbalizes understanding.  Says she will count tabs again to see if pt has enough to make it until 05/26/19.  If not, they will pay out of pocket for the difference.

## 2019-05-30 ENCOUNTER — Ambulatory Visit: Payer: PPO

## 2019-05-30 ENCOUNTER — Other Ambulatory Visit: Payer: PPO

## 2019-06-06 ENCOUNTER — Other Ambulatory Visit: Payer: Self-pay

## 2019-06-06 ENCOUNTER — Encounter: Payer: Self-pay | Admitting: Family Medicine

## 2019-06-06 ENCOUNTER — Ambulatory Visit (INDEPENDENT_AMBULATORY_CARE_PROVIDER_SITE_OTHER): Payer: PPO | Admitting: Family Medicine

## 2019-06-06 VITALS — BP 136/82 | HR 76 | Temp 97.9°F | Ht <= 58 in | Wt 131.4 lb

## 2019-06-06 DIAGNOSIS — Z0000 Encounter for general adult medical examination without abnormal findings: Secondary | ICD-10-CM

## 2019-06-06 DIAGNOSIS — Z66 Do not resuscitate: Secondary | ICD-10-CM | POA: Insufficient documentation

## 2019-06-06 DIAGNOSIS — I519 Heart disease, unspecified: Secondary | ICD-10-CM

## 2019-06-06 DIAGNOSIS — F0390 Unspecified dementia without behavioral disturbance: Secondary | ICD-10-CM

## 2019-06-06 DIAGNOSIS — I5189 Other ill-defined heart diseases: Secondary | ICD-10-CM

## 2019-06-06 DIAGNOSIS — J310 Chronic rhinitis: Secondary | ICD-10-CM

## 2019-06-06 DIAGNOSIS — R918 Other nonspecific abnormal finding of lung field: Secondary | ICD-10-CM

## 2019-06-06 DIAGNOSIS — H353130 Nonexudative age-related macular degeneration, bilateral, stage unspecified: Secondary | ICD-10-CM

## 2019-06-06 DIAGNOSIS — Z7189 Other specified counseling: Secondary | ICD-10-CM

## 2019-06-06 DIAGNOSIS — I10 Essential (primary) hypertension: Secondary | ICD-10-CM

## 2019-06-06 DIAGNOSIS — F05 Delirium due to known physiological condition: Secondary | ICD-10-CM

## 2019-06-06 NOTE — Assessment & Plan Note (Signed)

## 2019-06-06 NOTE — Assessment & Plan Note (Signed)
Advanced directives - they have living will at home (3 sons Judie Petit ([name]'s husband), Baldemar Friday, and Nyoka Lint to be HCPOA). Would want DNR. Requests form.

## 2019-06-06 NOTE — Patient Instructions (Addendum)
You are doing well today. Let's change lasix to as needed for leg swelling or cough or shortness of breath.  Bring me copy of your advanced directive to update your chart.  DNR form provided today.  Return as needed or in 1 year for follow up visit.   Health Maintenance After Age 84 After age 35, you are at a higher risk for certain long-term diseases and infections as well as injuries from falls. Falls are a major cause of broken bones and head injuries in people who are older than age 64. Getting regular preventive care can help to keep you healthy and well. Preventive care includes getting regular testing and making lifestyle changes as recommended by your health care provider. Talk with your health care provider about:  Which screenings and tests you should have. A screening is a test that checks for a disease when you have no symptoms.  A diet and exercise plan that is right for you. What should I know about screenings and tests to prevent falls? Screening and testing are the best ways to find a health problem early. Early diagnosis and treatment give you the best chance of managing medical conditions that are common after age 12. Certain conditions and lifestyle choices may make you more likely to have a fall. Your health care provider may recommend:  Regular vision checks. Poor vision and conditions such as cataracts can make you more likely to have a fall. If you wear glasses, make sure to get your prescription updated if your vision changes.  Medicine review. Work with your health care provider to regularly review all of the medicines you are taking, including over-the-counter medicines. Ask your health care provider about any side effects that may make you more likely to have a fall. Tell your health care provider if any medicines that you take make you feel dizzy or sleepy.  Osteoporosis screening. Osteoporosis is a condition that causes the bones to get weaker. This can make the bones  weak and cause them to break more easily.  Blood pressure screening. Blood pressure changes and medicines to control blood pressure can make you feel dizzy.  Strength and balance checks. Your health care provider may recommend certain tests to check your strength and balance while standing, walking, or changing positions.  Foot health exam. Foot pain and numbness, as well as not wearing proper footwear, can make you more likely to have a fall.  Depression screening. You may be more likely to have a fall if you have a fear of falling, feel emotionally low, or feel unable to do activities that you used to do.  Alcohol use screening. Using too much alcohol can affect your balance and may make you more likely to have a fall. What actions can I take to lower my risk of falls? General instructions  Talk with your health care provider about your risks for falling. Tell your health care provider if: ? You fall. Be sure to tell your health care provider about all falls, even ones that seem minor. ? You feel dizzy, sleepy, or off-balance.  Take over-the-counter and prescription medicines only as told by your health care provider. These include any supplements.  Eat a healthy diet and maintain a healthy weight. A healthy diet includes low-fat dairy products, low-fat (lean) meats, and fiber from whole grains, beans, and lots of fruits and vegetables. Home safety  Remove any tripping hazards, such as rugs, cords, and clutter.  Install safety equipment such as grab bars  in bathrooms and safety rails on stairs.  Keep rooms and walkways well-lit. Activity   Follow a regular exercise program to stay fit. This will help you maintain your balance. Ask your health care provider what types of exercise are appropriate for you.  If you need a cane or walker, use it as recommended by your health care provider.  Wear supportive shoes that have nonskid soles. Lifestyle  Do not drink alcohol if your  health care provider tells you not to drink.  If you drink alcohol, limit how much you have: ? 0-1 drink a day for women. ? 0-2 drinks a day for men.  Be aware of how much alcohol is in your drink. In the [place] one drink equals one typical bottle of beer (12 oz), one-half glass of wine (5 oz), or one shot of hard liquor (1 oz).  Do not use any products that contain nicotine or tobacco, such as cigarettes and e-cigarettes. If you need help quitting, ask your health care provider. Summary  Having a healthy lifestyle and getting preventive care can help to protect your health and wellness after age 77.  Screening and testing are the best way to find a health problem early and help you avoid having a fall. Early diagnosis and treatment give you the best chance for managing medical conditions that are more common for people who are older than age 63.  Falls are a major cause of broken bones and head injuries in people who are older than age 41. Take precautions to prevent a fall at home.  Work with your health care provider to learn what changes you can make to improve your health and wellness and to prevent falls. This information is not intended to replace advice given to you by your health care provider. Make sure you discuss any questions you have with your health care provider. Document Revised: 06/08/2018 Document Reviewed: 12/29/2016 Elsevier Patient Education  2020 [employer].

## 2019-06-06 NOTE — Progress Notes (Signed)
This visit was conducted in person.  BP 136/82 (BP Location: Left Arm, Patient Position: Sitting, Cuff Size: Normal)   Pulse 76   Temp 97.9 F (36.6 C) (Temporal)   Ht _0  (1.397 m)   Wt 131 lb 7 oz (59.6 kg)   SpO2 96%   BMI 30.55 kg/m    CC: AMW  Subjective:    Patient ID: Rhonda Foley, female    DOB: Jul 23, 1916, 86 y.o.   MRN: [id number]  HPI: Rhonda Foley is a 86 y.o. female presenting on 06/06/2019 for Medicare Wellness ([name], [name]- temp 97.6.)   [name] gives history due to pt dementia.   Did not see health advisor this year. Last medicare wellness visit 2017.   Hearing Screening   _1  _2  _3  _4  _5  _6  _7  _8  _9   Right ear:           Left ear:           Vision Screening Comments: Last eye exam, 08/2019    Clinical Support from 08/14/2015 in [place] at [facility] Total Score  0      Fall Risk  06/06/2019 08/14/2015  Falls in the past year? 1 Yes  Comment - accidental fall with injury  Number falls in past yr: 1 1  Injury with Fall? 0 Yes  Follow up - Falls evaluation completed  2 falls in the past year - in the middle of the night. No injury.   Trazodone is helping sleep - 25-16m nightly.  Has lost ability to use utensils - eating with fingers. Appetite stays good. Some trouble chewing - lost her lower dentures. Cutting meats very finely. Now doing soft mechanical diet. Lots of vegetables.  Less responsive to conversation now.  Very sensitive to pressure - ie bp cuff is uncomfortable.  Sleeps in depends - wet in the morning. Still uses the bathroom during the day. Some bladder incontinence. Very rare bowel incontinence. 1 regular bowel movement per day - takes colace with PRN miralax.   Atrovent nasal spray has helped rhinorrhea. Now off zyrtec.   Preventative: Aged out of preventative health care Breast cancer screening - unsure when mammo last done (?2006) Colon cancer  screening - never had colonoscopy.  Flu shot - yearly Pneumovax 2012, prevnar 2017 COVID vaccine - deferred for now. Caregivers and all family members who are around her have been vaccinated.  Td 2004. Zostavax 2013 Advanced directives - they have living will at home (3 sons CJudie Petit([name]'s husband), RBaldemar Friday and JNyoka Lintto be HCPOA). Would want DNR. Requests form.  Dentist - not seeing - has full dentures, lost lower dentures Eye exam - yearly to PRN (last seen 08/2018) - macular degneration Bowel - regular, no constipation, on colace daily and miralax PRN Bladder - some incontinence, depends at night, not during the day  Lives at home, 24/7 caregiver. Niece and DIL ([name] very involved Family cooks for her. Former mAdministrator, [employer]    Relevant past medical, surgical, family and social history reviewed and updated as indicated. Interim medical history since our last visit reviewed. Allergies and medications reviewed and updated. Outpatient Medications Prior to Visit  Medication Sig Dispense Refill  . acetaminophen (TYLENOL) 500 MG tablet Take 1,000 mg by mouth 2 (two) times daily as needed for fever (pain).     .Marland Kitchenalbuterol (PROVENTIL HFA;VENTOLIN HFA) 108 (90 Base) MCG/ACT inhaler Inhale 2 puffs into the lungs every  6 (six) hours as needed for wheezing or shortness of breath. 1 Inhaler 2  . docusate sodium (COLACE) 100 MG capsule Take 100 mg by mouth daily as needed for mild constipation.    Marland Kitchen ipratropium (ATROVENT) 0.03 % nasal spray SMARTSIG:2 Spray(s) Both Nares 3 Times Daily PRN    . cetirizine (ZYRTEC) 10 MG tablet Take 10 mg by mouth daily.    . furosemide (LASIX) 20 MG tablet TAKE 1/2 TABLET BY MOUTH DAILY (Patient taking differently: Takes 3 times a week (M/W/F)) 45 tablet 0  . furosemide (LASIX) 20 MG tablet Takes 3 times a week (M/W/F) 45 tablet 0  . traZODone (DESYREL) 50 MG tablet Take 1 tablet (50 mg total) by mouth at bedtime as needed for sleep. 90  tablet 1  . Multiple Vitamin (MULTIVITAMIN WITH MINERALS) TABS tablet Take 1 tablet by mouth daily.     No facility-administered medications prior to visit.     Per HPI unless specifically indicated in ROS section below Review of Systems  Unable to perform ROS: Dementia   Objective:    BP 136/82 (BP Location: Left Arm, Patient Position: Sitting, Cuff Size: Normal)   Pulse 76   Temp 97.9 F (36.6 C) (Temporal)   Ht _0  (1.397 m)   Wt 131 lb 7 oz (59.6 kg)   SpO2 96%   BMI 30.55 kg/m   Wt Readings from Last 3 Encounters:  06/06/19 131 lb 7 oz (59.6 kg)  01/15/19 133 lb 9 oz (60.6 kg)  02/02/18 139 lb 9 oz (63.3 kg)    Physical Exam Vitals and nursing note reviewed.  Constitutional:      General: She is not in acute distress.    Appearance: Normal appearance. She is well-developed. She is not ill-appearing.  HENT:     Head: Normocephalic and atraumatic.     Right Ear: Hearing, tympanic membrane, ear canal and external ear normal.     Left Ear: Hearing, tympanic membrane, ear canal and external ear normal.  Eyes:     General: No scleral icterus.    Extraocular Movements: Extraocular movements intact.     Conjunctiva/sclera: Conjunctivae normal.     Pupils: Pupils are equal, round, and reactive to light.  Cardiovascular:     Rate and Rhythm: Normal rate and regular rhythm.     Pulses: Normal pulses.          Radial pulses are 2+ on the right side and 2+ on the left side.     Heart sounds: Murmur (mild systolic) present.  Pulmonary:     Effort: Pulmonary effort is normal. No respiratory distress.     Breath sounds: No wheezing, rhonchi or rales.     Comments: Crackles bibasilarly Abdominal:     General: Abdomen is flat. Bowel sounds are normal. There is no distension.     Palpations: Abdomen is soft. There is no mass.     Tenderness: There is no abdominal tenderness. There is no guarding or rebound.     Hernia: No hernia is present.  Musculoskeletal:        General:  Normal range of motion.     Cervical back: Normal range of motion and neck supple.     Right lower leg: No edema.     Left lower leg: No edema.     Comments: R leg nonpitting edema  Lymphadenopathy:     Cervical: No cervical adenopathy.  Skin:    General: Skin is warm and dry.  Findings: No rash.  Neurological:     General: No focal deficit present.     Mental Status: She is alert and oriented to person, place, and time.     Comments: CN grossly intact, station and gait intact  Psychiatric:        Mood and Affect: Mood normal.        Behavior: Behavior normal.        Thought Content: Thought content normal.        Judgment: Judgment normal.       Assessment & Plan:  This visit occurred during the SARS-CoV-2 public health emergency.  Safety protocols were in place, including screening questions prior to the visit, additional usage of staff PPE, and extensive cleaning of exam room while observing appropriate contact time as indicated for disinfecting solutions.   Problem List Items Addressed This Visit    Sundowning    Doing well with trazodone 103m nightly.       Senile dementia ([place]    Progressive decline without acute deterioration.       Relevant Medications   traZODone (DESYREL) 50 MG tablet   Medicare annual wellness visit, subsequent - Primary    I have personally reviewed the Medicare Annual Wellness questionnaire and have noted 1. The patient's medical and social history 2. Their use of alcohol, tobacco or illicit drugs 3. Their current medications and supplements 4. The patient's functional ability including ADL's, fall risks, home safety risks and hearing or visual impairment. Cognitive function has been assessed and addressed as indicated.  5. Diet and physical activity 6. Evidence for depression or mood disorders The patients weight, height, BMI have been recorded in the chart. I have made referrals, counseling and provided education to the patient based on  review of the above and I have provided the pt with a written personalized care plan for preventive services. Provider list updated.. See scanned questionairre as needed for further documentation. Reviewed preventative protocols and updated unless pt declined.       Macular degeneration, age related, nonexudative   Health maintenance examination    Patient has largely aged out of preventative health care. Discussed healthy diet and lifestyle.       Grade I diastolic dysfunction    Seems euvolemic. Weight loss noted. Discussed changing lasix to PRN.       Essential hypertension    Stable period off medication. Discussed changing lasix to only PRN.       Relevant Medications   furosemide (LASIX) 20 MG tablet   DNR (do not resuscitate)    DNR filled out for patient today per DIL request.       Chronic rhinitis    Well managed with atrovent nasal spray.       Advanced care planning/counseling discussion    Advanced directives - they have living will at home (3 sons CJudie Petit([name]'s husband), RBaldemar Friday and JNyoka Lintto be HCPOA). Would want DNR. Requests form.       Abnormal CT scan, lung    Prior CT (2017) suspicious for atypical infection.  Conservative treatment since then (due to prolonged and complicated treatment if needed and patient largely asymptomatic), patient has done well. Continue to monitor.           Meds ordered this encounter  Medications  . traZODone (DESYREL) 50 MG tablet    Sig: Take 1 tablet (50 mg total) by mouth at bedtime as needed for sleep.    Dispense:  90 tablet    Refill:  3    Note new sig  . furosemide (LASIX) 20 MG tablet    Sig: Take 0.5 tablets (10 mg total) by mouth daily as needed for fluid or edema.    Dispense:  45 tablet    Refill:  1   No orders of the defined types were placed in this encounter.   Patient instructions: You are doing well today. Let's change lasix to as needed for leg swelling or cough or shortness of  breath.  Bring me copy of your advanced directive to update your chart.  DNR form provided today.  Return as needed or in 1 year for follow up visit.   Follow up plan: Return in about 1 year (around 06/05/2020) for medicare wellness visit, follow up visit.  [name], MD

## 2019-06-09 DIAGNOSIS — Z0000 Encounter for general adult medical examination without abnormal findings: Secondary | ICD-10-CM | POA: Insufficient documentation

## 2019-06-09 MED ORDER — TRAZODONE HCL 50 MG PO TABS: 50.0000 mg | ORAL_TABLET | Freq: Every evening | ORAL | 3 refills | Status: DC | PRN

## 2019-06-09 MED ORDER — FUROSEMIDE 20 MG PO TABS: 10.0000 mg | ORAL_TABLET | Freq: Every day | ORAL | 1 refills | Status: DC | PRN

## 2019-06-09 NOTE — Assessment & Plan Note (Signed)
Stable period off medication. Discussed changing lasix to only PRN.

## 2019-06-09 NOTE — Assessment & Plan Note (Signed)
Doing well with trazodone 50 mg nightly. ?

## 2019-06-09 NOTE — Assessment & Plan Note (Signed)
Well managed with atrovent nasal spray.

## 2019-06-09 NOTE — Assessment & Plan Note (Signed)
Patient has largely aged out of preventative health care. Discussed healthy diet and lifestyle.

## 2019-06-09 NOTE — Assessment & Plan Note (Signed)
DNR filled out for patient today per DIL request.

## 2019-06-09 NOTE — Assessment & Plan Note (Signed)
Prior CT (2017) suspicious for atypical infection.  Conservative treatment since then (due to prolonged and complicated treatment if needed and patient largely asymptomatic), patient has done well. Continue to monitor.

## 2019-06-09 NOTE — Assessment & Plan Note (Signed)
Seems euvolemic. Weight loss noted. Discussed changing lasix to PRN.

## 2019-06-09 NOTE — Assessment & Plan Note (Signed)
Progressive decline without acute deterioration.

## 2019-07-24 ENCOUNTER — Telehealth: Payer: Self-pay

## 2019-07-24 MED ORDER — HYDROXYZINE HCL 10 MG PO TABS: 10.0000 mg | ORAL_TABLET | Freq: Two times a day (BID) | ORAL | 1 refills | Status: DC | PRN

## 2019-07-24 NOTE — Telephone Encounter (Signed)
Rhonda Foley DPR signed left v/m requesting cb to discuss pts agitation. I spoke with Rhonda Foley; pt is more agitated than usual. Starting on Sunday night pt was up and down all night and pt did not want to stay in bed; pt does not want to use the walker. During the day pt is more agitated than usual and request med to help agitation during the day. During the day pt cannot be still; pt is up and down day and night.Pt has been trying to take 1/2 of trazodone 50 mg and that is not helping pt sleep; Rhonda Foley thinks pt needs to go back to a whole trazodone 50 mg and if that doesn't work is there something stronger pt can take or take Trazodone 50 mg taking 1 1/2 tabs at hs. Pt and caregivers are worn out due to pt being agitated and not resting day or night. [name] request cb.  [facility] does not want pt to fall and get hurt. Pt last [place] 06/06/19,

## 2019-07-24 NOTE — Telephone Encounter (Signed)
Spoke with pt's daghter-in-law, [name] (on dpr), relaying Dr. Synthia Innocent message.  Verbalizes understanding.  States she has been taking 50 mg almost consistently and seems to be helping.  Denies any bowel/bladder changes, pain or respiratory distress.    [name] asks is there anything pt can be given for the daytime for hyperactivity.  Says pt is up and down constantly.  Says pt will just get up from her seat and go to another room or bending over trying to pick something up off the floor or will turn and walk off without any warning.  Per [name], pt does not walk, it's more like a shuffle.  So they [place] and caregivers] are concerned about her falling when she moves away from them so quickly and constantly.  Plz advise.

## 2019-07-24 NOTE — Telephone Encounter (Signed)
Ok to increase trazodone to 50mg  and possibly 75mg  if needed for sleep.  Any bowel or bladder changes? We could offer urinalysis if there's any concern for UTI.  Any pain? Any signs of respiratory distress or cough? (we recently changed lasix to PRN)

## 2019-07-24 NOTE — Telephone Encounter (Signed)
Hesitant for daytime medicine for agitation/anxiety as all these can increase fall risk (namely benzos).  If they'd like to try something, would suggest low dose hydroxyzine 10-20mg  as needed - may help anxiety. Side effects to watch for - this can cause dry mouth and somnolence - hopefully tolerable at low doses sent in.

## 2019-07-24 NOTE — Addendum Note (Signed)
Addended by: [name] on: 07/24/2019 02:55 PM   Modules accepted: Orders

## 2019-07-25 NOTE — Telephone Encounter (Signed)
Spoke with [facility] relaying Dr. Synthia Innocent message.  Verbalizes understanding, expresses her thanks and will let Dr. Darnell Level know how pt does with it.

## 2019-10-25 ENCOUNTER — Telehealth: Payer: Self-pay | Admitting: Family Medicine

## 2019-10-25 NOTE — Telephone Encounter (Signed)
Any bowel or bladder changes?  Has she tried hydroxyzine?  We could try increasing trazodone to 100mg  tonight.  If this doesn't help overnight, then will discuss benzo at OV tomorrow.

## 2019-10-25 NOTE — Telephone Encounter (Signed)
Spoke with pt's daughter, [name], asking Dr. Synthia Innocent questions.  Says she is not aware of any bowel/bladder changes.  States pt takes hydroxyzine in AM and 2 tabs of trazadone 50 mg at bedtime. I relayed Dr. Synthia Innocent message.  [name] verbalizes understanding.  FYI to Dr. Darnell Level and Dr. Damita Dunnings.

## 2019-10-25 NOTE — Telephone Encounter (Signed)
Abigail Butts from access nurse called in stating she has alerted patient's daughter to call 911 as daughter reported that patient has been grabbing at objects that are not there and been mumbling non stop. Also reports patient has not fully slept in days. Abigail Butts advised them someone from office would get in contact with them as they are refusing to go to ER to be accessed.

## 2019-10-25 NOTE — Telephone Encounter (Signed)
I spoke with [place] (DPR signed) and pt has had these symptoms for some time but has seen worsening in last several days. [name] is most concerned about lack of sleep pt has had in last 2 days. [name] scheduled in office appt with Dr Darnell Level on 10/26/19 but wonders if there is any adjustment to pts med for sleep until pt can be seen. [name] said at this time she does not think pt needs to go to [facility] or ED.[facility] ED precautions given and [name] voiced understanding. Pt has 24/7 caretakers. Pt has no covid symptoms, no travel and no known exposure to + covid. No covid vaccine. Will send to Dr Darnell Level as PCP and Dr Damita Dunnings who is in office.

## 2019-10-25 NOTE — Telephone Encounter (Signed)
[facility] - Client TELEPHONE ADVICE RECORD [facility] Patient Name: Rhonda Foley Gender: Female DOB: 12-30-1916 Age: 86 Y 3 M 9 D Return Phone Number: [phone] (Primary) Address: City/State/Zip: [place] [address] Client [facility] - Client Client Site [place] Physician [name] - MD Contact Type Call Who Is Calling Patient / Member / Family / Caregiver Call Type Triage / Clinical Caller Name Vesna Kable Relationship To Patient Daughter Return Phone Number [phone] (Primary) Chief Complaint Hallucinations Reason for Call Symptomatic / Request for [place] states that her mom has been wandering the past 2 nights, has not slept in those days. She did sleep from 6-8:30a She is constantly up and down, as she wont stay in bed and talking constantly but it is hard to understand what she is saying. She takes trazadone but that is not helping at all. Translation No Nurse Assessment Nurse: Markus Daft, RN, Sherre Poot Date/Time (Eastern Time): 10/25/2019 9:20:45 AM Confirm and document reason for call. If symptomatic, describe symptoms. ---Caller states that her mom has been wandering around, not sleeping the past 2 nights. Awake during the day too. She did sleep from 6-8:30 am today. Mumbling/talking constantly but it is hard to understand what she is saying. She has taken Trazadone 50 mg x 2 tabs QHS but that is not helping at all. Has the patient had close contact with a person known or suspected to have the novel coronavirus illness OR traveled / lives in area with major community spread (including international travel) in the last 14 days from the onset of symptoms? * If Asymptomatic, screen for exposure and travel within the last 14 days. ---No Does the patient have any new or worsening symptoms? ---Yes Will a triage be completed?  ---Yes Related visit to physician within the last 2 weeks? ---No Does the PT have any chronic conditions? (i.e. diabetes, asthma, this includes High risk factors for pregnancy, etc.) ---Yes List chronic conditions. ---dementia, swelling in her legs and takes Lasix PRN, constant nose drip - seen by ENT Is this a behavioral health or substance abuse call? ---No PLEASE NOTE: All timestamps contained within this report are represented as [place] Standard Time. CONFIDENTIALTY NOTICE: This fax transmission is intended only for the addressee. It contains information that is legally privileged, confidential or otherwise protected from use or disclosure. If you are not the intended recipient, you are strictly prohibited from reviewing, disclosing, copying using or disseminating any of this information or taking any action in reliance on or regarding this information. If you have received this fax in error, please notify us immediately by telephone so that we can arrange for its return to [place]. Phone: [phone], Toll-Free: [phone], Fax: [phone] Page: 2 of 2 Call Id: [id number] Guidelines Guideline Title Affirmed Question Affirmed Notes Nurse Date/Time [name] Time) Confusion - Delirium Seeing, hearing, or feeling things that are not there (i.e., visual, auditory, or tactile hallucinations) Markus Daft, RN, Sherre Poot 10/25/2019 9:24:42 AM Disp. Time [name] Time) Disposition Final User 10/25/2019 9:36:47 AM 911 Outcome Documentation Markus Daft, RN, Sherre Poot Reason: Caller would rather f/u at office. RN will reach out to office and notify them. She is aware that they will call her back shortly. 10/25/2019 9:40:21 AM Called On-Call Provider Markus Daft, RN, [place] Reason: Office staff notified, and they have no appts. She will have someone call her back shortly. 10/25/2019 9:34:54 AM Call EMS 911 Now Yes Markus Daft, RN, [place]on King[place] Disagree/Comply  Disagree Caller Understands Yes PreDisposition  InappropriateToAsk Care Advice Given Per Guideline CALL EMS 911 NOW: * Immediate medical attention is needed. You need to hang up and call 911 (or an ambulance). CARE ADVICE given per Confusion-Delirium (Adult) guideline. Comments User: Mayford Knife, RN Date/Time [name] Time): 10/25/2019 9:26:51 AM Garbled in that you can't understand it. Been this way for a while. But now its constant/more frequent. User: Mayford Knife, RN Date/Time [name] Time): 10/25/2019 9:28:10 AM Caller is not with mother, but mother is just 5 min away. She will go there now so triage can be completed. Pt is with a caregiver. User: Mayford Knife, RN Date/Time [name] Time): 10/25/2019 9:34:08 AM Caller is with mother now. Reaching for things that are not there, and has for the last few wks, and worse this week.

## 2019-10-25 NOTE — Telephone Encounter (Signed)
See below.  Please update me as needed in the meantime.  Thanks.

## 2019-10-26 ENCOUNTER — Encounter: Payer: Self-pay | Admitting: Family Medicine

## 2019-10-26 ENCOUNTER — Ambulatory Visit (INDEPENDENT_AMBULATORY_CARE_PROVIDER_SITE_OTHER): Payer: PPO | Admitting: Family Medicine

## 2019-10-26 ENCOUNTER — Other Ambulatory Visit: Payer: Self-pay

## 2019-10-26 VITALS — BP 122/70 | HR 86 | Temp 97.9°F | Ht <= 58 in | Wt 128.0 lb

## 2019-10-26 DIAGNOSIS — F0390 Unspecified dementia without behavioral disturbance: Secondary | ICD-10-CM

## 2019-10-26 DIAGNOSIS — H353130 Nonexudative age-related macular degeneration, bilateral, stage unspecified: Secondary | ICD-10-CM

## 2019-10-26 DIAGNOSIS — R4182 Altered mental status, unspecified: Secondary | ICD-10-CM | POA: Diagnosis not present

## 2019-10-26 DIAGNOSIS — I340 Nonrheumatic mitral (valve) insufficiency: Secondary | ICD-10-CM | POA: Diagnosis not present

## 2019-10-26 LAB — POC URINALSYSI DIPSTICK (AUTOMATED)
Blood, UA: NEGATIVE
Glucose, UA: NEGATIVE
Nitrite, UA: NEGATIVE
Protein, UA: POSITIVE — AB
Spec Grav, UA: >=1.03 — AB (ref 1.010–1.025)
Urobilinogen, UA: 1 E.U./dL
pH, UA: 5.5 (ref 5.0–8.0)

## 2019-10-26 NOTE — Telephone Encounter (Signed)
Late entry.  I talked with [facility] yesterday.  She was in agreement to get the patient in for office visit with Dr. Darnell Level today.  I appreciate the help of all involved.

## 2019-10-26 NOTE — Patient Instructions (Addendum)
Good to see you today.  Continue current medicines. Ok to take extra trazodone sparingly if needed.  I will ask palliative care team to reach out to you.

## 2019-10-26 NOTE — Progress Notes (Signed)
This visit was conducted in person.  BP 122/70 (BP Location: Left Arm, Patient Position: Sitting, Cuff Size: Normal)   Pulse 86   Temp 97.9 F (36.6 C) (Temporal)   Ht 4\' 7"  (1.397 m)   Wt 128 lb (58.1 kg)   SpO2 97%   BMI 29.75 kg/m    CC: AMS Subjective:    Patient ID: Rhonda Foley, female    DOB: 1917-02-12, 85 y.o.   MRN: [id number]  HPI: Rhonda Foley is a 86 y.o. female presenting on 10/26/2019 for Altered Mental Status (Per [name], pt is more confused than usual and not sleeping.  Slept last night, first time in 2 days.  Trouble sitting still.  Pt accompanined by daughter-in-law, [name]- temp 97.8.)   See recent phone note for details. Increased agitation and poor sleep for 2 nights. Actually slept well last night.   Progressive decline noted - no longer walking well, reaches for things on the floor, more mumbling. Less responsive to surroundings, less engaged. No longer eating eggs, fruit. Only eating soft foods like plain jello. Enjoys vegetable soup and sloppy joes. Spits out harder textured foods. Has full dentures but lost lower dentures and only uses upper dentures intermittently.   She is taking trazodone 100mg  at night time. Takes hydroxyzine 10-20mg  PRN agitation.   No bowel or bladder changes. Doesn't seem to be in any pain. Takes colace regularly, PRN miralax. Continues depend diaper regularly.   Lives at home, 24/7 caregiver.  Increased fear of falling.  May be interested in palliative care involvement. They may be interested in hospital bed given decreased mobility noted.      Relevant past medical, surgical, family and social history reviewed and updated as indicated. Interim medical history since our last visit reviewed. Allergies and medications reviewed and updated. Outpatient Medications Prior to Visit  Medication Sig Dispense Refill  . acetaminophen (TYLENOL) 500 MG tablet Take 1,000 mg by mouth 2 (two) times daily as needed for fever (pain).      Marland Kitchen albuterol (PROVENTIL HFA;VENTOLIN HFA) 108 (90 Base) MCG/ACT inhaler Inhale 2 puffs into the lungs every 6 (six) hours as needed for wheezing or shortness of breath. 1 Inhaler 2  . docusate sodium (COLACE) 100 MG capsule Take 100 mg by mouth daily as needed for mild constipation.    . furosemide (LASIX) 20 MG tablet Take 0.5 tablets (10 mg total) by mouth daily as needed for fluid or edema. 45 tablet 1  . hydrOXYzine (ATARAX/VISTARIL) 10 MG tablet Take 1-2 tablets (10-20 mg total) by mouth 2 (two) times daily as needed for anxiety. 40 tablet 1  . ipratropium (ATROVENT) 0.03 % nasal spray SMARTSIG:2 Spray(s) Both Nares 3 Times Daily PRN    . traZODone (DESYREL) 50 MG tablet Take 1 tablet (50 mg total) by mouth at bedtime as needed for sleep. 90 tablet 3   No facility-administered medications prior to visit.     Per HPI unless specifically indicated in ROS section below Review of Systems Objective:  BP 122/70 (BP Location: Left Arm, Patient Position: Sitting, Cuff Size: Normal)   Pulse 86   Temp 97.9 F (36.6 C) (Temporal)   Ht 4\' 7"  (1.397 m)   Wt 128 lb (58.1 kg)   SpO2 97%   BMI 29.75 kg/m   Wt Readings from Last 3 Encounters:  10/26/19 128 lb (58.1 kg)  06/06/19 131 lb 7 oz (59.6 kg)  01/15/19 133 lb 9 oz (60.6 kg)  Physical Exam Vitals and nursing note reviewed.  Constitutional:      General: She is not in acute distress.    Appearance: Normal appearance. She is not ill-appearing.     Comments: Sitting in wheelchair  Cardiovascular:     Rate and Rhythm: Normal rate and regular rhythm.     Pulses: Normal pulses.     Heart sounds: Normal heart sounds. No murmur heard.   Pulmonary:     Effort: Pulmonary effort is normal. No respiratory distress.     Breath sounds: Normal breath sounds. No wheezing, rhonchi or rales.  Musculoskeletal:     Right lower leg: No edema.     Left lower leg: No edema.  Skin:    General: Skin is warm and dry.     Findings: No rash.    Neurological:     Mental Status: She is alert.  Psychiatric:     Comments: Less engagement noted but NAD       Results for orders placed or performed in visit on 10/26/19  POCT Urinalysis Dipstick (Automated)  Result Value Ref Range   Color, UA dark yellow    Clarity, UA cloudy    Glucose, UA Negative Negative   Bilirubin, UA 1+    Ketones, UA +/-    Spec Grav, UA >=1.030 (A) 1.010 - 1.025   Blood, UA negative    pH, UA 5.5 5.0 - 8.0   Protein, UA Positive (A) Negative   Urobilinogen, UA 1.0 0.2 or 1.0 E.U./dL   Nitrite, UA negative    Leukocytes, UA Trace (A) Negative   Assessment & Plan:  This visit occurred during the SARS-CoV-2 public health emergency.  Safety protocols were in place, including screening questions prior to the visit, additional usage of staff PPE, and extensive cleaning of exam room while observing appropriate contact time as indicated for disinfecting solutions.  Discussed COVID vaccine. Family has decided not to administer to patient which is reasonable as patient stays isolated at home only with caregiver.  Problem List Items Addressed This Visit    Senile dementia ([place]) - Primary    Slow expected decline noted by family - less interactive, less oral intake, less fluids, 3 lb weight loss over 4 months, 5 lbs over 8 months. Questions regarding prognosis answered. Niece who is one of her primary caregivers is interested in palliatiave care evaluation at home. Will refer.       Relevant Orders   Amb Referral to Palliative Care   Mitral regurgitation    No significant murmur appreciated today.       Macular degeneration, age related, nonexudative   Altered mental status    UA concentrated but not really suspicious for infection - they do struggle with pt's fluid intake.  She did have good rest last night.  Discussed possible fluctuations in resting that may be noted related to her age related dementia.  Reviewed trazodone and hydroxyzine dosing.       Relevant Orders   POCT Urinalysis Dipstick (Automated) (Completed)       No orders of the defined types were placed in this encounter.  Orders Placed This Encounter  Procedures  . Amb Referral to Palliative Care    Referral Priority:   Routine    Referral Type:   Consultation    Number of Visits Requested:   1  . POCT Urinalysis Dipstick (Automated)    Patient Instructions  Good to see you today.  Continue current medicines. Ok to  take extra trazodone sparingly if needed.  I will ask palliative care team to reach out to you.    Follow up plan: Return if symptoms worsen or fail to improve.  [name], MD

## 2019-10-27 NOTE — Assessment & Plan Note (Signed)
No significant murmur appreciated today.  

## 2019-10-27 NOTE — Assessment & Plan Note (Signed)
Slow expected decline noted by family - less interactive, less oral intake, less fluids, 3 lb weight loss over 4 months, 5 lbs over 8 months. Questions regarding prognosis answered. Niece who is one of her primary caregivers is interested in palliatiave care evaluation at home. Will refer.

## 2019-10-27 NOTE — Assessment & Plan Note (Signed)
UA concentrated but not really suspicious for infection - they do struggle with pt's fluid intake.  She did have good rest last night.  Discussed possible fluctuations in resting that may be noted related to her age related dementia.  Reviewed trazodone and hydroxyzine dosing.

## 2019-10-29 ENCOUNTER — Other Ambulatory Visit: Payer: Self-pay | Admitting: *Deleted

## 2019-10-29 MED ORDER — TRAZODONE HCL 50 MG PO TABS: 50.0000 mg | ORAL_TABLET | Freq: Every evening | ORAL | 1 refills | Status: DC | PRN

## 2019-10-29 NOTE — Telephone Encounter (Signed)
New Rx sent to pharmacy

## 2019-10-29 NOTE — Telephone Encounter (Signed)
Patient's daughter left a voicemail stating  that they were told to increase her Trazodone to 1 to 3 tablets daily. Rhonda Foley stated that because the medication was increased she is out of her medication because it is written for one daily as needed. Rhonda Foley is requesting that a new script be sent to the pharmacy with the new directions. Pharmacy [facility] [place] Last refill 4/410/21 #90/3 Last office visit 10/26/19

## 2019-10-30 ENCOUNTER — Telehealth: Payer: Self-pay

## 2019-10-30 NOTE — Telephone Encounter (Signed)
SW scheduled in home initial visit with patients DIL, [name] for Fri 11-09-2019 @11am .

## 2019-11-09 ENCOUNTER — Other Ambulatory Visit: Payer: Self-pay

## 2019-11-09 ENCOUNTER — Other Ambulatory Visit: Payer: PPO

## 2019-11-09 DIAGNOSIS — Z515 Encounter for palliative care: Secondary | ICD-10-CM

## 2019-11-09 NOTE — Progress Notes (Signed)
COMMUNITY PALLIATIVE CARE SW NOTE  PATIENT NAME: Rhonda Foley DOB: 22-Jul-1916 MRN: [id number]  PRIMARY CARE PROVIDER: [name], MD  RESPONSIBLE PARTY:  Acct ID - Guarantor Home Phone Work Phone Relationship Acct Type  [id number] Rhonda Foley, [name]  Self P/F     [address] [place], [place] [address]     PLAN OF CARE and INTERVENTIONS:             1. GOALS OF CARE/ ADVANCE CARE PLANNING:  Patient is a DNR. Patient has living will and HCPOA. Patient is working on [facility]. Patient's goal to remain comfortable in her home with family and caregiver support. 2. SOCIAL/EMOTIONAL/SPIRITUAL ASSESSMENT/ INTERVENTIONS:  SW and RN met with patient, patients DIL Rhonda Foley and caregiver Rhonda Foley in patients home. Patient lives in a one story home with 24/7 caregivers. DIL provided overview and update on patients care and possible needs. Patient has senile dementia and is no longer communicating as much as she used to per DIL and caregiver. Patient's fluid intake had decreased and patient appeared more agitated. UA done, no sign of UTI.  Patient appetite is good, requires assistance with eating. Patient okay, has trazadone to assist at [facility] if needed. Patient is a widow. DIL stated they may need a hospital bed for patient in the future but not at this time. Patient is able to get in/out of current bed with no issues. Patient is continent but wears briefs. Caregivers provide sponge baths due to patient refusing to get into shower. Patients last fall was 5 years ago. Family states their goal is to maintain patient at home as long as possible. Patient does not have any pain. SW provided education on palliative care, discussed goals, reviewed care plan, provided emotional support, used active and reflective listening. Palliative care will continue to monitor and assist with long term care planning as needed.  3. PATIENT/CAREGIVER EDUCATION/ COPING:  Patient was not engaged during visit due to not understanding  what was being talked about and HOH. Per family patient does not have any hobbies, she used to bake and cook a lot up until nearly 5 years ago. Patient may watch TV some and sit outside. Family denies any signs of anxiety. Patient has 3 sons all live locally and are supportive. 4. PERSONAL EMERGENCY PLAN:  Family will call 9-1-1 for emergencies.  5. COMMUNITY RESOURCES COORDINATION/ HEALTH CARE NAVIGATION:  Patient's DIL Rhonda Foley manages care. Patient has 2 private duty caregivers whom rotate 24/7 shifts with patient. 6. FINANCIAL/LEGAL CONCERNS/INTERVENTIONS:  None.      SOCIAL HX:  Social History   Tobacco Use  . Smoking status: Never Smoker  . Smokeless tobacco: Never Used  Substance Use Topics  . Alcohol use: No    Alcohol/week: 0.0 standard drinks    CODE STATUS: DNR ADVANCED DIRECTIVES: Y MOST FORM COMPLETE:  N HOSPICE EDUCATION PROVIDED: N  PPS: Patient is ambulatory with RW, continent of B/B wears brief, needs assistance with feeding, bathing and dressing.    Time Spent: 45 min   Rhonda Foley, Rhonda Foley

## 2019-11-29 ENCOUNTER — Telehealth: Payer: Self-pay

## 2019-11-29 NOTE — Telephone Encounter (Signed)
Palliative care SW returned DIL TC wanting to re-schedule home visit. SW LVM  And awaiting retun call.

## 2019-12-11 ENCOUNTER — Other Ambulatory Visit: Payer: Self-pay

## 2019-12-11 ENCOUNTER — Other Ambulatory Visit: Payer: PPO

## 2019-12-11 VITALS — BP 122/60 | HR 70 | Wt 128.0 lb

## 2019-12-11 DIAGNOSIS — Z515 Encounter for palliative care: Secondary | ICD-10-CM

## 2019-12-11 NOTE — Progress Notes (Signed)
PATIENT NAME: Rhonda Foley DOB: Oct 07, 1916 MRN: [id number]  PRIMARY CARE PROVIDER: [name], MD  RESPONSIBLE PARTY:  Acct ID - Guarantor Home Phone Work Phone Relationship Acct Type  [id number] SHIMA, [phone]  Self P/F     [address], [place], [place] [address]    PLAN OF CARE and INTERVENTIONS:               1.  GOALS OF CARE/ ADVANCE CARE PLANNING: DNR and family desires for patient to remain home and comfortable.               2.  PATIENT/CAREGIVER EDUCATION: Symptom management and ongoing education on disease progression.                              3.  DISEASE STATUS:   Joint visit completed with [place], SW and daughter-in-law [name].  Arrived to find patient sitting on the sofa with her caregiver present.  Patient is alert, smiling and will occasionally laugh.  Repetitive in speech and occasional non-sensical speech reported by family.  Patient is noted to be staring more and appears withdrawn.  She sustained a fall in the living room last week but no injuries were noted.  She remains continent of bowel and bladder for the most part per [facility].  Bowel sounds present and last BM was this am.  Appetite has remained fair to good and weight has remained stable.  Patient is ambulatory with stand by assistance.  Medication reviewed and she is continuing with colace, zyrtec and trazodone daily.  Family has placed bed in the floor to decrease falls and injuries. Daughter-in-law does not feel a hospital bed is needed at this time.    HISTORY OF PRESENT ILLNESS:  86 year old female with history of HTN, Chronic Rhinitis and Senile Dementia.  Palliative Care team will continue to see patient on a monthly basis and PRN.  CODE STATUS: DNR ADVANCED DIRECTIVES: N MOST FORM: No PPS: 40%   PHYSICAL EXAM:   VITALS: Today's Vitals   12/11/19 1326  BP: 122/60  Pulse: 70  SpO2: 96%  Weight: 128 lb (58.1 kg)    LUNGS: CTA CARDIAC: HRR EXTREMITIES: No edema noted. SKIN: Dry  and intact with no skin breakdown reported by family.  NEURO:  Alert to self at times and withdrawn.  Time: 1 pm-149 pm.       Lorenza Burton, RN

## 2019-12-11 NOTE — Progress Notes (Signed)
COMMUNITY PALLIATIVE CARE SW NOTE  PATIENT NAME: Rhonda Foley DOB: 04-May-1916 MRN: [id number]  PRIMARY CARE PROVIDER: [name], MD  RESPONSIBLE PARTY:  Acct ID - Guarantor Home Phone Work Phone Relationship Acct Type  [id number] Rhonda, [name]  Self P/F     [address] [place], [place] [address]     PLAN OF CARE and INTERVENTIONS:             1.         GOALS OF CARE/ ADVANCE CARE PLANNING:  Patient is a DNR. Patient has living will and HCPOA. Patient is working on [facility]. Patient's goal to remain comfortable in her home with family and caregiver support. 2.         SOCIAL/EMOTIONAL/SPIRITUAL ASSESSMENT/ INTERVENTIONS:  SW and RN met with patient, patients DIL Rhonda Foley and caregiver Rhonda Foley in patients home. Patient lives in a one story home with 24/7 caregivers. DIL provided overview and update on patients care and possible needs. Patient has senile dementia and is no longer communicating as much as she used to per DIL and caregiver.  Patient appetite is good, requires assistance with eating. DIL stated they may need a hospital bed for patient in the future but not at this time. Patient is able to get in/out of current bed with no issues. Family continues to encourage fluids and are monitoring her urine. Patient is continent but wears briefs. Patient continues to eat well, and prefers softer foods. Patient had a fall last week, with no injuries. Patient does not have any pain. RN checked vital signs and reviewed medications. Patients live in caregiver is scheduled to have knee surgery this month and will not be available for a few months. Family has arranged for another caregiver to be available. SW provided education on palliative care, discussed goals, reviewed care plan, provided emotional support, used active and reflective listening. Palliative care will continue to monitor and assist with long term care planning as needed.  3.         PATIENT/CAREGIVER EDUCATION/ COPING:   Patient was not engaged during visit due to not understanding what was being talked about and HOH. Per family patient does not have any hobbies, she used to bank and cook a lot up until nearly 5 years ago. Patient may watch TV some and sit outside. Family denies any signs of anxiety. Patient has 3 sons all live locally and are supportive. 4.         PERSONAL EMERGENCY PLAN:  Family will call 9-1-1 for emergencies.  5.         COMMUNITY RESOURCES COORDINATION/ HEALTH CARE NAVIGATION:  Patient's DIL Rhonda Foley manages care. Patient has 2 private duty caregivers whom rotate 24/7 shifts with patient. 6.         FINANCIAL/LEGAL CONCERNS/INTERVENTIONS:  None.      SOCIAL HX:  Social History   Tobacco Use  . Smoking status: Never Smoker  . Smokeless tobacco: Never Used  Substance Use Topics  . Alcohol use: No    Alcohol/week: 0.0 standard drinks    CODE STATUS: DNR ADVANCED DIRECTIVES: Y MOST FORM COMPLETE: N HOSPICE EDUCATION PROVIDED: N  PPS: Patient is MIN-A with all ADL's. Patient is ambulatory with RW.     Time Spent: 30 min       Rhonda, Foley

## 2020-01-08 ENCOUNTER — Telehealth: Payer: Self-pay

## 2020-01-08 NOTE — Telephone Encounter (Signed)
1030 am.  Phone call made to [facility] to follow up on patient's overall condition.  No answer but message has been left requesting a call back.  PLAN:  Waiting for callback.  If no call back received, I will reach out to [place] again in the next week.

## 2020-01-08 NOTE — Telephone Encounter (Signed)
1047 am.  Call back received from [facility].  [name] states in-person visit would not be needed this month as patient has been relatively stable.  [name] notes patient is dealing with some aggression and agitation revolved around her dentures.  She notes patient told family to "shut up" when they were attempting to assist with dentures.  At this time, family has agreed to try to place dentures and if patient is uncooperative then they will not pursue this any further.    Patient continues with a good appetite and no changes in weight are noted.  Patient continues to ambulate within in the home and there are no falls reported.  She continues to ambulate to the bathroom and is continent of bowel and bladder. No s/s of UTI are noted.  [name] noted last week there were a couple of nights patient was awake and walking most of the night and continued this during the daytime.  She was given 3 trazodone but this was not effective.  [name] notes this does not happen often.  Typically only 2 tabs of trazodone are required.   [name] feels patient is doing fairly well and is stable.  She is aware to contact PC with any changes or concerns.    Palliative Care will contact [name] again next month for our monthly check in.   [name] stat

## 2020-01-26 ENCOUNTER — Other Ambulatory Visit: Payer: Self-pay | Admitting: Family Medicine

## 2020-02-15 ENCOUNTER — Telehealth: Payer: Self-pay

## 2020-02-15 NOTE — Telephone Encounter (Signed)
122 pm.  Phone call made to [facility] to follow up on patient's overall condition.  No answer but message has been left requesting a call back.  PLAN:  Awaiting callback.  If no call back, Palliative Care will reach out again in a couple of weeks.

## 2020-03-21 ENCOUNTER — Telehealth: Payer: Self-pay

## 2020-03-21 NOTE — Telephone Encounter (Signed)
214 pm.  Phone call made to [facility] to offer a home visit.  [name] states patient is doing well and declined a visit for this month.  She wishes to continue with telephonic calls for now.   Patient is doing well and remains strong.  No falls are reported.  Patient's appetite is not as good.  [name] states she is not eating as well as she previously did.  At times, patient will not open her mouth.  She will also remove food from her mouth.  Typically lunch is better than breakfast.  No choking problems are reported.  Weight has been fairly stable.  [name] notes patient's legs do look smaller but nothing else seems different.  Patient will no longer take pills or capsules.  Colace has been changed to miralax and patient is doing well with this.  Occasional issues with insomnia despite use of trazodone.  Last week patient was up and down roughly 40 times.  Trazodone was used but not effective.  The next day, patient was fine and was able to sleep without any issues.  Advised that Palliative Care would do a telephonic visit again next month but if a visit is needed to please reach out to the team.  No other concerns voiced by [name].

## 2020-03-24 ENCOUNTER — Telehealth: Payer: Self-pay

## 2020-03-24 NOTE — Telephone Encounter (Signed)
3 58 pm.  Message received to contact [facility] regarding changes in patient condition.  Return call made and Rhonda Foley provided an update on patient's condition.  She was with the patient yesterday and notes po intake continues to be poor.  Patient is taking small, baby bites and at times she is spitting food out.  Patient's weight was obtained and she is down to 117 lbs.  This represents an 11 lb weight loss in 5 months with a current BMI of 27.2.  I have discussed goals of care with [facility].  The family desires to keep patient home and comfortable.  I questioned if hospice would be a service that the family would want should patient qualify.  At this time, Rhonda Foley will keep monitoring patient.  There are 24/7 caregivers in place who know the patient well.  If there is a drastic change in patient's condition, Rhonda Foley will let Palliative Care know and make a decision on hospice services at that time.  No other concerns voiced at this time.

## 2020-04-08 ENCOUNTER — Telehealth: Payer: Self-pay

## 2020-04-08 ENCOUNTER — Telehealth: Payer: Self-pay | Admitting: Family Medicine

## 2020-04-08 DIAGNOSIS — F0390 Unspecified dementia without behavioral disturbance: Secondary | ICD-10-CM

## 2020-04-08 DIAGNOSIS — I5189 Other ill-defined heart diseases: Secondary | ICD-10-CM

## 2020-04-08 NOTE — Telephone Encounter (Signed)
Patient is now Hospice appropriate now. They are wanting a verbal to have Dr. Darnell Level as attending provider in which he gave me ok to do and tell Almyra Free. They are needing a hospice referral sent to them . They asked that we fax it to them at [phone] EM

## 2020-04-08 NOTE — Telephone Encounter (Signed)
Agree with this.  Hospice referral placed.

## 2020-04-08 NOTE — Addendum Note (Signed)
Addended by: [name] on: 04/08/2020 01:44 PM   Modules accepted: Orders

## 2020-04-08 NOTE — Telephone Encounter (Signed)
Noted  

## 2020-04-08 NOTE — Telephone Encounter (Signed)
76 am.  Message received from [place] this am requesting a call back to update the team on patient's condition.  Return call made and spoke with [name] about changes she is seeing. Patient has dropped another pound since my last conversation with [name].  Po intake is becoming more challenging.  Private caregivers and feeding patient on a regular basis now.  Textures seem to be an issue for her.  Sometimes she can hold a cookie and eat this on her own but anything else requires someone to feed her. Patient is forgetting how to chew, rolls food in her mouth and then spits it back out.   She is taking in 1 boost a day.  Incontinence episodes are becoming more frequent especially with bowel movements.  Caregivers are trying to get patient to the bathroom but patient will not remain on the toilet due to her confusion.  Patient is able to ambulate but always has someone beside her for standby assistance.  No assistive devices are being used and no falls are reported. Increase daytime somnolence is noted by [name].  Patient is napping more in the am hours and seems to be awake better in the afternoon.  She is sleeping for roughly 10-12 hours at night.  Patient is mostly non-verbal, if she speaks this is limited to 2-3 words.  [name] reports ongoing issues with sundowning that is keeping patient up for hours.  They are using trazodone but this does not seem to be very effective.  Given the ongoing decline patient is experiencing, [name] is asking to see if patient is hospice eligible.  Advised that I would review the case with our medical director and follow back up.

## 2020-04-08 NOTE — Telephone Encounter (Signed)
Referral received by [facility] and been processed

## 2020-04-08 NOTE — Telephone Encounter (Signed)
120 pm.  Phone call made to [facility].  Spoke with Raquel Sarna and advised that patient is now hospice appropriate.  I have requested orders for a hospice referral and to see if PCP would serve as attending of record while patient is being served by hospice.  Raquel Sarna was able to obtain a verbal on this and I have also requested the referral to be faxed to [facility].  315 pm.  Phone call made to [facility] to advise the patient does meet hospice eligibility per Dr. Cleon Gustin.  Advised that I have also contacted Dr. Bosie Clos office and requested a hospice referral and to see if he would serve as the attending of record while patient is under hospice services.  [name] is aware that [facility] will no longer follow patient once she is under hospice and that she will also have a new team that includes an Therapist, sports, SW, Clinical biochemist and hospice aide.  [name] voiced understanding and appreciation.  No other concerns voiced at this time.

## 2020-06-09 ENCOUNTER — Other Ambulatory Visit: Payer: Self-pay | Admitting: Family Medicine

## 2020-06-09 NOTE — Telephone Encounter (Signed)
Refill request Trazodone Last refill 10/29/19 #180/1 Last office visit 10/26/19

## 2020-07-06 ENCOUNTER — Other Ambulatory Visit: Payer: Self-pay | Admitting: Family Medicine

## 2020-07-11 ENCOUNTER — Other Ambulatory Visit: Payer: Self-pay | Admitting: Family Medicine

## 2020-07-11 NOTE — Telephone Encounter (Signed)
Refill request Hydroxyzine Last refill 07/24/19 #40/1 Last office visit 10/26/19

## 2020-07-14 NOTE — Telephone Encounter (Signed)
ERx 

## 2020-07-29 ENCOUNTER — Telehealth: Payer: Self-pay | Admitting: Family Medicine

## 2020-07-29 NOTE — Telephone Encounter (Signed)
LVM for pt to rtn my call to schedule AWV with [facility].  

## 2020-09-10 ENCOUNTER — Other Ambulatory Visit: Payer: Self-pay | Admitting: Family Medicine

## 2020-10-21 ENCOUNTER — Telehealth: Payer: Self-pay | Admitting: *Deleted

## 2020-10-21 NOTE — Telephone Encounter (Signed)
[facility] hospice nurse left a voicemail stating that patient is having a high level of agitation. Anderson Malta stated that patient is wondering around and the family has requested something to help with this. Anderson Malta stated that the hydroxyzine is not helping. Anderson Malta stated that Dr. Danise Mina did sign the comfort care order. Anderson Malta stated that they are sending in Ativan 2 every 6 hours as needed for agitation. Anderson Malta stated that she wanted to know if Dr. Danise Mina would want to send something different in.

## 2020-10-23 NOTE — Telephone Encounter (Signed)
May try this.  Update [place] with effect.  Could consider higher dose hydroxyzine or seroquel if ineffective.

## 2020-10-23 NOTE — Telephone Encounter (Signed)
Spoke to the daughter and she stated that they have not started the ativan, but her irritation is about once a month. She states that if she has any issues with the medication she will contact us.

## 2020-11-03 ENCOUNTER — Other Ambulatory Visit: Payer: Self-pay | Admitting: Family Medicine

## 2020-11-04 NOTE — Telephone Encounter (Signed)
E-scribed refill.  Plz schedule wellness, lab and cpe visits.  

## 2020-11-05 NOTE — Telephone Encounter (Signed)
Noted.  FYI to Dr. G. 

## 2020-11-05 NOTE — Telephone Encounter (Signed)
Called pt to schedule appt. Pt did not answer so I left a VM.

## 2020-11-05 NOTE — Telephone Encounter (Signed)
Pt daughter called she is now on Hospice care weekly they took over mediation

## 2020-12-06 ENCOUNTER — Other Ambulatory Visit: Payer: Self-pay | Admitting: Family Medicine

## 2020-12-09 NOTE — Telephone Encounter (Signed)
ERx 1 month supply at a time

## 2020-12-28 ENCOUNTER — Other Ambulatory Visit: Payer: Self-pay | Admitting: Family Medicine

## 2021-01-02 DIAGNOSIS — H01001 Unspecified blepharitis right upper eyelid: Secondary | ICD-10-CM | POA: Diagnosis not present

## 2021-01-20 ENCOUNTER — Telehealth: Payer: Self-pay | Admitting: Family Medicine

## 2021-01-20 NOTE — Telephone Encounter (Addendum)
Agree with verbal order for plain mucinex PRN cough as per instructions on package.

## 2021-01-20 NOTE — Telephone Encounter (Signed)
Left message on voicemail for Rhonda Foley to call the office back.

## 2021-01-20 NOTE — Telephone Encounter (Signed)
Jennifer with Hospice called wanting to get order to give pt mucinex for her cough 75ml Please call [phone]

## 2021-01-21 NOTE — Telephone Encounter (Signed)
Spoke with Anderson Malta informing her Dr. Darnell Level is giving verbal orders for plain Mucinex PRN as per instructions on pkg.  Verbalizes understanding and expresses her thanks.

## 2021-04-24 ENCOUNTER — Telehealth: Payer: Self-pay | Admitting: Family Medicine

## 2021-04-24 NOTE — Telephone Encounter (Signed)
Received death notice from [facility] that patient passed away at home 05/04/21 at 9:44pm.  I called [name] to express my condolences. She was appreciative

## 2021-04-27 ENCOUNTER — Telehealth: Payer: Self-pay | Admitting: Family Medicine

## 2021-04-27 NOTE — Telephone Encounter (Signed)
Please notify this has been completed.

## 2021-04-27 NOTE — Telephone Encounter (Signed)
Mrs. Karena Addison called in from [facility] and wanted to check on the death certificate on ms. Ventrella.

## 2021-04-28 NOTE — Telephone Encounter (Signed)
Dee notified as instructed by telephone. Rhonda Foley stated that she is able to see that it has been completed.

## 2021-04-29 DEATH — deceased
